# Patient Record
Sex: Female | Born: 1945 | Race: White | Hispanic: No | Marital: Married | Smoking: Never smoker
Health system: Southern US, Community
[De-identification: ages and names within clinical notes are randomized; demographics above are authoritative.]

## PROBLEM LIST (undated history)

## (undated) DIAGNOSIS — M8580 Other specified disorders of bone density and structure, unspecified site: Secondary | ICD-10-CM

## (undated) DIAGNOSIS — J349 Unspecified disorder of nose and nasal sinuses: Secondary | ICD-10-CM

## (undated) DIAGNOSIS — I7300 Raynaud's syndrome without gangrene: Secondary | ICD-10-CM

## (undated) DIAGNOSIS — M792 Neuralgia and neuritis, unspecified: Secondary | ICD-10-CM

## (undated) DIAGNOSIS — N6019 Diffuse cystic mastopathy of unspecified breast: Secondary | ICD-10-CM

## (undated) DIAGNOSIS — F419 Anxiety disorder, unspecified: Secondary | ICD-10-CM

## (undated) DIAGNOSIS — H8109 Meniere's disease, unspecified ear: Secondary | ICD-10-CM

## (undated) DIAGNOSIS — G479 Sleep disorder, unspecified: Secondary | ICD-10-CM

## (undated) HISTORY — PX: UPPER GASTROINTESTINAL ENDOSCOPY: SHX188

## (undated) HISTORY — PX: COLONOSCOPY: SHX174

## (undated) HISTORY — PX: TONSILLECTOMY AND ADENOIDECTOMY: SUR1326

## (undated) HISTORY — PX: EYE SURGERY: SHX253

## (undated) HISTORY — DX: Neuralgia and neuritis, unspecified: M79.2

## (undated) HISTORY — PX: KNEE SURGERY: SHX244

## (undated) HISTORY — DX: Raynaud's syndrome without gangrene: I73.00

## (undated) HISTORY — DX: Unspecified disorder of nose and nasal sinuses: J34.9

## (undated) HISTORY — DX: Other specified disorders of bone density and structure, unspecified site: M85.80

## (undated) HISTORY — DX: Meniere's disease, unspecified ear: H81.09

## (undated) HISTORY — DX: Sleep disorder, unspecified: G47.9

## (undated) HISTORY — DX: Diffuse cystic mastopathy of unspecified breast: N60.19

## (undated) HISTORY — DX: Anxiety disorder, unspecified: F41.9

---

## 1995-08-05 HISTORY — PX: PARTIAL HYSTERECTOMY: SHX80

## 1997-12-13 ENCOUNTER — Ambulatory Visit (HOSPITAL_COMMUNITY): Admission: RE | Admit: 1997-12-13 | Discharge: 1997-12-13 | Payer: Self-pay | Admitting: *Deleted

## 1998-05-17 ENCOUNTER — Emergency Department (HOSPITAL_COMMUNITY): Admission: EM | Admit: 1998-05-17 | Discharge: 1998-05-17 | Payer: Self-pay | Admitting: Emergency Medicine

## 1998-11-19 ENCOUNTER — Other Ambulatory Visit: Admission: RE | Admit: 1998-11-19 | Discharge: 1998-11-19 | Payer: Self-pay | Admitting: Gynecology

## 1999-03-01 ENCOUNTER — Ambulatory Visit (HOSPITAL_COMMUNITY): Admission: RE | Admit: 1999-03-01 | Discharge: 1999-03-01 | Payer: Self-pay | Admitting: Gynecology

## 1999-03-01 ENCOUNTER — Encounter: Payer: Self-pay | Admitting: Gynecology

## 2000-03-03 ENCOUNTER — Encounter: Payer: Self-pay | Admitting: *Deleted

## 2000-03-03 ENCOUNTER — Ambulatory Visit (HOSPITAL_COMMUNITY): Admission: RE | Admit: 2000-03-03 | Discharge: 2000-03-03 | Payer: Self-pay | Admitting: *Deleted

## 2000-03-12 ENCOUNTER — Encounter: Payer: Self-pay | Admitting: General Surgery

## 2000-03-12 ENCOUNTER — Inpatient Hospital Stay (HOSPITAL_COMMUNITY): Admission: EM | Admit: 2000-03-12 | Discharge: 2000-03-13 | Payer: Self-pay | Admitting: Emergency Medicine

## 2000-03-12 ENCOUNTER — Encounter: Payer: Self-pay | Admitting: Emergency Medicine

## 2000-03-18 ENCOUNTER — Other Ambulatory Visit: Admission: RE | Admit: 2000-03-18 | Discharge: 2000-03-18 | Payer: Self-pay | Admitting: *Deleted

## 2001-03-08 ENCOUNTER — Encounter: Payer: Self-pay | Admitting: *Deleted

## 2001-03-08 ENCOUNTER — Ambulatory Visit (HOSPITAL_COMMUNITY): Admission: RE | Admit: 2001-03-08 | Discharge: 2001-03-08 | Payer: Self-pay | Admitting: *Deleted

## 2002-01-20 ENCOUNTER — Encounter: Admission: RE | Admit: 2002-01-20 | Discharge: 2002-01-20 | Payer: Self-pay | Admitting: Family Medicine

## 2002-01-20 ENCOUNTER — Encounter: Payer: Self-pay | Admitting: Family Medicine

## 2002-10-25 ENCOUNTER — Encounter: Admission: RE | Admit: 2002-10-25 | Discharge: 2002-10-25 | Payer: Self-pay | Admitting: Family Medicine

## 2002-10-25 ENCOUNTER — Encounter: Payer: Self-pay | Admitting: Family Medicine

## 2004-02-02 ENCOUNTER — Ambulatory Visit (HOSPITAL_COMMUNITY): Admission: RE | Admit: 2004-02-02 | Discharge: 2004-02-02 | Payer: Self-pay | Admitting: *Deleted

## 2005-02-10 ENCOUNTER — Ambulatory Visit (HOSPITAL_COMMUNITY): Admission: RE | Admit: 2005-02-10 | Discharge: 2005-02-10 | Payer: Self-pay | Admitting: *Deleted

## 2006-02-12 ENCOUNTER — Ambulatory Visit (HOSPITAL_COMMUNITY): Admission: RE | Admit: 2006-02-12 | Discharge: 2006-02-12 | Payer: Self-pay | Admitting: Obstetrics and Gynecology

## 2006-05-14 ENCOUNTER — Encounter: Admission: RE | Admit: 2006-05-14 | Discharge: 2006-05-14 | Payer: Self-pay | Admitting: Family Medicine

## 2007-02-10 ENCOUNTER — Encounter: Admission: RE | Admit: 2007-02-10 | Discharge: 2007-02-10 | Payer: Self-pay | Admitting: Orthopedic Surgery

## 2007-02-15 ENCOUNTER — Ambulatory Visit (HOSPITAL_COMMUNITY): Admission: RE | Admit: 2007-02-15 | Discharge: 2007-02-15 | Payer: Self-pay | Admitting: Obstetrics & Gynecology

## 2007-02-24 ENCOUNTER — Encounter: Admission: RE | Admit: 2007-02-24 | Discharge: 2007-02-24 | Payer: Self-pay | Admitting: Orthopedic Surgery

## 2007-03-15 ENCOUNTER — Other Ambulatory Visit: Admission: RE | Admit: 2007-03-15 | Discharge: 2007-03-15 | Payer: Self-pay | Admitting: Obstetrics and Gynecology

## 2007-03-30 ENCOUNTER — Ambulatory Visit (HOSPITAL_COMMUNITY): Admission: RE | Admit: 2007-03-30 | Discharge: 2007-03-30 | Payer: Self-pay | Admitting: Obstetrics & Gynecology

## 2008-03-15 ENCOUNTER — Ambulatory Visit (HOSPITAL_COMMUNITY): Admission: RE | Admit: 2008-03-15 | Discharge: 2008-03-15 | Payer: Self-pay | Admitting: Obstetrics & Gynecology

## 2008-03-30 ENCOUNTER — Encounter: Admission: RE | Admit: 2008-03-30 | Discharge: 2008-03-30 | Payer: Self-pay | Admitting: Obstetrics & Gynecology

## 2008-08-04 HISTORY — PX: BREAST EXCISIONAL BIOPSY: SUR124

## 2008-08-22 ENCOUNTER — Encounter: Admission: RE | Admit: 2008-08-22 | Discharge: 2008-08-22 | Payer: Self-pay | Admitting: Internal Medicine

## 2009-01-18 ENCOUNTER — Encounter: Admission: RE | Admit: 2009-01-18 | Discharge: 2009-01-18 | Payer: Self-pay | Admitting: Neurology

## 2009-01-22 ENCOUNTER — Encounter: Admission: RE | Admit: 2009-01-22 | Discharge: 2009-01-22 | Payer: Self-pay | Admitting: Obstetrics and Gynecology

## 2009-03-14 ENCOUNTER — Ambulatory Visit (HOSPITAL_BASED_OUTPATIENT_CLINIC_OR_DEPARTMENT_OTHER): Admission: RE | Admit: 2009-03-14 | Discharge: 2009-03-14 | Payer: Self-pay | Admitting: Surgery

## 2009-03-14 ENCOUNTER — Encounter (INDEPENDENT_AMBULATORY_CARE_PROVIDER_SITE_OTHER): Payer: Self-pay | Admitting: Surgery

## 2010-04-15 ENCOUNTER — Ambulatory Visit (HOSPITAL_COMMUNITY): Admission: RE | Admit: 2010-04-15 | Discharge: 2010-04-15 | Payer: Self-pay | Admitting: Surgery

## 2010-08-14 ENCOUNTER — Encounter: Payer: Self-pay | Admitting: Gastroenterology

## 2010-08-26 ENCOUNTER — Encounter: Payer: Self-pay | Admitting: Orthopedic Surgery

## 2010-08-26 ENCOUNTER — Encounter: Payer: Self-pay | Admitting: Obstetrics & Gynecology

## 2010-09-02 ENCOUNTER — Encounter (INDEPENDENT_AMBULATORY_CARE_PROVIDER_SITE_OTHER): Payer: Self-pay | Admitting: *Deleted

## 2010-09-05 NOTE — Letter (Signed)
Summary: Colonoscopy Letter  [facility]  [address] [place], [place] [address]   Phone: [phone]  Fax: [phone]      August 14, 2010 MRN: [id number]   [facility] [address] [place], [place]  [address]   Dear Ms. Start,   According to your medical record, it is time for you to schedule a Colonoscopy. The American Cancer Society recommends this procedure as a method to detect early colon cancer. Patients with a family history of colon cancer, or a personal history of colon polyps or inflammatory bowel disease are at increased risk.  This letter has been generated based on the recommendations made at the time of your procedure. If you feel that in your particular situation this may no longer apply, please contact our office.  Please call our office at [phone] to schedule this appointment or to update your records at your earliest convenience.  Thank you for cooperating with [place] to provide you with the very best care possible.   Sincerely,  Judie Petit T. Russella Dar, M.D.  [facility] [phone]

## 2010-09-10 ENCOUNTER — Encounter (INDEPENDENT_AMBULATORY_CARE_PROVIDER_SITE_OTHER): Payer: Self-pay | Admitting: *Deleted

## 2010-09-11 ENCOUNTER — Encounter: Payer: Self-pay | Admitting: Gastroenterology

## 2010-09-11 NOTE — Letter (Signed)
Summary: Pre Visit Letter Revised  [facility]  [address] [place], [place] [address]   Phone: [phone]  Fax: [phone]        09/02/2010 MRN: [id number] Samantha Daugherty [address] [name], [place]  [address]  [place]                           Procedure Date: September 18, 2010                 recall col Dr Russella Dar  Welcome to the Gastroenterology Division at [facility].    You are scheduled to see a nurse for your pre-procedure visit on September 11, 2010 at 1:30pm on the 3rd floor at [facility], [address]  We ask that you try to arrive at our office 15 minutes prior to your appointment time to allow for check-in.  Please take a minute to review the attached form.  If you answer "Yes" to one or more of the questions on the first page, we ask that you call the person listed at your earliest opportunity.  If you answer "No" to all of the questions, please complete the rest of the form and bring it to your appointment.    Your nurse visit will consist of discussing your medical and surgical history, your immediate family medical history, and your medications.   If you are unable to list all of your medications on the form, please bring the medication bottles to your appointment and we will list them.  We will need to be aware of both prescribed and over the counter drugs.  We will need to know exact dosage information as well.    Please be prepared to read and sign documents such as consent forms, a financial agreement, and acknowledgement forms.  If necessary, and with your consent, a friend or relative is welcome to sit-in on the nurse visit with you.  Please bring your insurance card so that we may make a copy of it.  If your insurance requires a referral to see a specialist, please bring your referral form from your primary care physician.  No co-pay is required for this nurse visit.     If you cannot keep your appointment, please call  [phone] to cancel or reschedule prior to your appointment date.  This allows [place] the opportunity to schedule an appointment for another patient in need of care.    Thank you for choosing  Gastroenterology for your medical needs.  We appreciate the opportunity to care for you.  Please visit [place] at our website  to learn more about our practice.  Sincerely, The Gastroenterology Division

## 2010-09-18 ENCOUNTER — Encounter: Payer: Self-pay | Admitting: Gastroenterology

## 2010-09-18 ENCOUNTER — Other Ambulatory Visit (AMBULATORY_SURGERY_CENTER): Payer: BC Managed Care – PPO | Admitting: Gastroenterology

## 2010-09-18 DIAGNOSIS — Z1211 Encounter for screening for malignant neoplasm of colon: Secondary | ICD-10-CM

## 2010-09-18 DIAGNOSIS — K648 Other hemorrhoids: Secondary | ICD-10-CM

## 2010-09-19 NOTE — Letter (Signed)
Summary: [facility] Instructions  [facility]  [address] [place], [place] [address]   Phone: [phone]  Fax: [phone]       JUNIE AVILLA    [birth year]    MRN: [id number]        Procedure Day Dorna Bloom:  [facility] 09/18/10     Arrival Time:  10:00am     Procedure Time: 11:00am     Location of Procedure:                    Juliann Pares  [facility] (4th Floor)                       PREPARATION FOR COLONOSCOPY WITH MOVIPREP   Starting 5 days prior to your procedure  FRIDAY 02/10  do not eat nuts, seeds, popcorn, corn, beans, peas,  salads, or any raw vegetables.  Do not take any fiber supplements (e.g. Metamucil, Citrucel, and Benefiber).  THE DAY BEFORE YOUR PROCEDURE         DATE:  TUESDAY 02/14  1.  Drink clear liquids the entire day-NO SOLID FOOD  2.  Do not drink anything colored red or purple.  Avoid juices with pulp.  No orange juice.  3.  Drink at least 64 oz. (8 glasses) of fluid/clear liquids during the day to prevent dehydration and help the prep work efficiently.  CLEAR LIQUIDS INCLUDE: Water Jello Ice Popsicles Tea (sugar ok, no milk/cream) Powdered fruit flavored drinks Coffee (sugar ok, no milk/cream) Gatorade Juice: apple, white grape, white cranberry  Lemonade Clear bullion, consomm, broth Carbonated beverages (any kind) Strained chicken noodle soup Hard Candy                             4.  In the morning, mix first dose of MoviPrep solution:    Empty 1 Pouch A and 1 Pouch B into the disposable container    Add lukewarm drinking water to the top line of the container. Mix to dissolve    Refrigerate (mixed solution should be used within 24 hrs)  5.  Begin drinking the prep at 5:00 p.m. The MoviPrep container is divided by 4 marks.   Every 15 minutes drink the solution down to the next mark (approximately 8 oz) until the full liter is complete.   6.  Follow completed prep with 16 oz of clear liquid of your choice  (Nothing red or purple).  Continue to drink clear liquids until bedtime.  7.  Before going to bed, mix second dose of MoviPrep solution:    Empty 1 Pouch A and 1 Pouch B into the disposable container    Add lukewarm drinking water to the top line of the container. Mix to dissolve    Refrigerate  THE DAY OF YOUR PROCEDURE      DATE: [facility]  02/15  Beginning at  6:00 a.m. (5 hours before procedure):         1. Every 15 minutes, drink the solution down to the next mark (approx 8 oz) until the full liter is complete.  2. Follow completed prep with 16 oz. of clear liquid of your choice.    3. You may drink clear liquids until 9:00am  (2 HOURS BEFORE PROCEDURE).   MEDICATION INSTRUCTIONS  Unless otherwise instructed, you should take regular prescription medications with a small sip of water   as early as possible the  morning of your procedure.         OTHER INSTRUCTIONS  You will need a responsible adult at least 63 years of age to accompany you and drive you home.   This person must remain in the waiting room during your procedure.  Wear loose fitting clothing that is easily removed.  Leave jewelry and other valuables at home.  However, you may wish to bring a book to read or  an iPod/MP3 player to listen to music as you wait for your procedure to start.  Remove all body piercing jewelry and leave at home.  Total time from sign-in until discharge is approximately 2-3 hours.  You should go home directly after your procedure and rest.  You can resume normal activities the  day after your procedure.  The day of your procedure you should not:   Drive   Make legal decisions   Operate machinery   Drink alcohol   Return to work  You will receive specific instructions about eating, activities and medications before you leave.    The above instructions have been reviewed and explained to me by   Karl Bales RN  September 11, 2010 2:17 PM   I fully  understand and can verbalize these instructions _____________________________ Date _________

## 2010-09-19 NOTE — Miscellaneous (Signed)
Summary: [facility] previsit  Clinical Lists Changes  Medications: Added new medication of MOVIPREP 100 GM  SOLR (PEG-KCL-NACL-NASULF-NA ASC-C) As per prep instructions. - Signed Rx of MOVIPREP 100 GM  SOLR (PEG-KCL-NACL-NASULF-NA ASC-C) As per prep instructions.;  #1 x 0;  Signed;  Entered by: Karl Bales RN;  Authorized by: Meryl Dare MD [facility];  Method used: [facility] to [facility]  [phone]*, [address] [place], [place]  [address], Ph: [phone] or [phone], Fax: [phone] Observations: Added new observation of NKA: T (09/11/2010 13:52)    Prescriptions: MOVIPREP 100 GM  SOLR (PEG-KCL-NACL-NASULF-NA ASC-C) As per prep instructions.  #1 x 0   Entered by:   Karl Bales RN   Authorized by:   Meryl Dare MD [facility]   Signed by:   Karl Bales RN on 09/11/2010   Method used:   [facility] to        [facility]  [phone]* (retail)       [address] [place], [place]  [address]       Ph: [phone] or [phone]       Fax: [phone]   RxID:   [id number]

## 2010-09-25 NOTE — Procedures (Signed)
Summary: Colonoscopy  Patient: Samantha Daugherty Note: All result statuses are Final unless otherwise noted.  Tests: (1) Colonoscopy (COL)   COL Colonoscopy           DONE     [facility]     [address]     [place], [place]  [address]           COLONOSCOPY PROCEDURE REPORT           PATIENT:  Samantha Daugherty, Samantha Daugherty  MR#:  [id number]     BIRTHDATE:  Nov 03, 1945, 64 yrs. old  GENDER:  female     ENDOSCOPIST:  Judie Petit T. Russella Dar, MD, [facility]           PROCEDURE DATE:  09/18/2010     PROCEDURE:  Colonoscopy [address]     ASA CLASS:  Class II     INDICATIONS:  1) Routine Risk Screening     MEDICATIONS:   Fentanyl 75 mcg IV, Versed 9 mg IV     DESCRIPTION OF PROCEDURE:   After the risks benefits and     alternatives of the procedure were thoroughly explained, informed     consent was obtained.  Digital rectal exam was performed and     revealed no abnormalities.   The LB [id number]-[id number] endoscope     was introduced through the anus and advanced to the cecum, which     was identified by both the appendix and ileocecal valve, limited     by a tortuous and redundant colon. The quality of the prep was     good, using MoviPrep.  The instrument was then slowly withdrawn as     the colon was fully examined.     <<PROCEDUREIMAGES>>     FINDINGS:  A normal appearing cecum, ileocecal valve, and     appendiceal orifice were identified. The ascending, hepatic     flexure, transverse, splenic flexure, descending, sigmoid colon,     and rectum appeared unremarkable. Retroflexed views in the rectum     revealed internal hemorrhoids, small. The time to cecum =  6.25     minutes. The scope was then withdrawn (time =  11  min) from the     patient and the procedure completed.           COMPLICATIONS:  None           ENDOSCOPIC IMPRESSION:     1) Internal hemorrhoids     2) Otherwise normal colonoscopy           RECOMMENDATIONS:     1) Continue current colorectal screening for "routine risk"  patients with a repeat colonoscopy in 10 years.           Venita Lick. Russella Dar, MD, [facility]           CC:  Theressa Millard, MD           n.     [name] at 09/18/2010 11:16 AM           [name], [phone]  Note: An exclamation mark (!) indicates a result that was not dispersed into the flowsheet. Document Creation Date: 09/18/2010 11:17 AM _______________________________________________________________________  (1) Order result status: Final Collection or observation date-time: 09/18/2010 11:08 Requested date-time:  Receipt date-time:  Reported date-time:  Referring Physician:   Ordering Physician: Claudette Head [phone]) Specimen Source:  Source: Launa Grill Order Number: [id number] Lab site:   Appended Document: Colonoscopy  Clinical Lists Changes  Observations: Added new observation of COLONNXTDUE: 09/2020 (09/18/2010 16:55)

## 2010-11-09 LAB — BASIC METABOLIC PANEL
BUN: 9 mg/dL (ref 6–23)
Calcium: 9.1 mg/dL (ref 8.4–10.5)
Creatinine, Ser: 0.95 mg/dL (ref 0.4–1.2)
GFR calc Af Amer: >60 mL/min (ref >60)
GFR calc non Af Amer: 60 mL/min — ABNORMAL LOW (ref >60)
Glucose, Bld: 102 mg/dL — ABNORMAL HIGH (ref 70–99)

## 2010-11-09 LAB — POCT HEMOGLOBIN-HEMACUE: Hemoglobin: 13.8 g/dL (ref 12.0–15.0)

## 2010-12-17 NOTE — Op Note (Signed)
Samantha Daugherty, Samantha Daugherty               ACCOUNT NO.:  [id number]   MEDICAL RECORD NO.:  [id number]          PATIENT TYPE:  AMB   LOCATION:  DSC                          FACILITY:  [facility]   PHYSICIAN:  Currie Paris, M.D.DATE OF BIRTH:  1945/09/12   DATE OF PROCEDURE:  DATE OF DISCHARGE:                               OPERATIVE REPORT   PREOPERATIVE DIAGNOSIS:  Nipple discharge, right breast.   POSTOPERATIVE DIAGNOSIS:  Nipple discharge, right breast.   OPERATION:  Ductal excision, right breast.   SURGEON:  Currie Paris, MD   ANESTHESIA:  General.   CLINICAL HISTORY:  This is a 65 year old lady with intermittent nipple  discharge from the right breast that was spontaneous, occasionally  bloody.  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   DESCRIPTION OF PROCEDURE:  The patient was seen in the holding area, and  she had no further questions.  We both marked the right breast as the  operative side.   The patient was taken to the operating room.  After satisfactory general  anesthesia had been obtained, the breast was prepped and draped.  The  time-out was done.   I used a 4.0 tear duct probe and was able to cannulate the appropriate  duct as it was readily producing clear yellow discharge.  I was able to  dilate this up to a 3.0 and then place a 24-gauge Angiocath and I put a  little bit of blue dye in.  The tear duct probe was then replaced.   I then made a curvilinear incision at the areolar margin and elevated  the skin up and created a skin flap over towards the duct and was able  to identify the probe and the duct and divided the duct just underneath  the nipple and grasped the tract of it with an Allis clamp.  There was  also blue dye that had extruded and I took area of the central ductal  excision to include the area that I thought was involved with the duct  as well  as that the area where blue dye had spilled out.  I did see what  looked like some dilated ducts filled with a thick brownish liquidy  fluid consistent with some fibrocystic changes.   Once I had the area well sampled, I put in some Marcaine 0.25% plain for  postop pain relief and then closed in layers with some 3-0 Vicryl, 4-0  Monocryl subcuticular plus Dermabond.   The patient tolerated the procedure well.  There were no complications.  All counts were correct.      Currie Paris, M.D.  Electronically Signed     CJS/MEDQ  D:  03/14/2009  T:  03/15/2009  Job:  [id number]   cc:   Theressa Millard, M.D.  Lum Keas, MD

## 2010-12-20 NOTE — H&P (Signed)
[facility]  Patient:    Samantha Daugherty, Samantha Daugherty                      MRN: [id number] Adm. Date:  [id number] Attending:  Roque Lias                         History and Physical  CHIEF COMPLAINT:  "Abdominal pain."  HISTORY OF PRESENT ILLNESS:  The patient is a 65 year old female who has had crampy lower abdominal midline pain since 3 p.m. yesterday.  The pain lasted several hours, then got better, but then began again this evening and was fairly severe this evening.  Patient states it is about the equivalent of labor pains.  It has been associated with nausea and large-volume emesis, without blood or coffee-grounds material.  Patient has not had a bowel movement in two days.  She denies fever, chills, urinary symptoms or GYN complaints.  SURGERIES:  Patient had a hysterectomy in 1997 due to fibroid tumors.  OTHER HOSPITALIZATIONS:  None.  OTHER ILLNESSES:  Patient has been treated for hypertension.  MEDICATIONS 1. Zestril 10 mg once a day. 2. Premarin 0.625 mg once a day. 3. Alprazolam p.r.n.  ALLERGIES:  None.  FAMILY HISTORY:  No family members have had any recent diarrheal or gastroenteritis symptoms; otherwise, unremarkable.  SOCIAL HISTORY:  The patient is married.  She works as a Runner, broadcasting/film/video.  She drinks socially and has never used tobacco.  REVIEW OF SYSTEMS:  Denies other systemic, skin, eye, ENT, respiratory, cardiovascular, GI, GU, musculoskeletal or neurologic complaints.  PHYSICAL EXAMINATION  VITAL SIGNS:  Blood pressure 176/103, pulse 88 and regular, respirations 16, temperature 97.5.  GENERAL:  Alert.  Patient vomited a large amount of digested food while being examined.  SKIN:  Clear, warm and dry.  No rash.  HEENT:  Eyes:  Pupils are equal, round and reactive to light.  Full EOMs. Fundi were benign.  Disks were flat and sclerae were nonicteric.  ENT:  TMs normal.  No intraoral lesions.  Pharynx clear.  Mucous membranes  moist.  NECK:  Supple.  No adenopathy, JVD or bruit.  Thyroid normal.  LUNGS:  Clear to auscultation and percussion.  HEART:  Regular rate and rhythm.  No gallop or murmur.  ABDOMEN:  Soft.  There was moderate left lower quadrant and lower midline abdominal pain to palpation, without guarding or rebound.  There was no right lower quadrant or upper abdominal pain to palpation.  There was no hepatosplenomegaly or mass.  Bowel sounds were hyperactive.  RECTAL:  No masses.  Stool was heme-negative.  EXTREMITIES:  No edema.  Pulses full.  NEUROLOGIC:  Alert, oriented x 3.  Speech was clear and appropriate.  No extremity weakness or tremor.  DTRs 2+ and symmetrical.  Babinskis downgoing. Cranial nerves intact.  LABORATORY AND X-RAY FINDINGS:  CBC shows hemoglobin 15.4, WBC 13,400.  CMET reveals a glucose of 122; otherwise, normal.  Amylase was 96.  KUB shows multiple dilated loops of bowel with air-fluid levels.  IMPRESSION 1. Partial small-bowel obstruction. 2. Hypertension.  PLAN 1. IV fluids. 2. NG suction. 3. Surgical consult. DD:  03/12/00 TD:  03/12/00 Job: [id number]

## 2011-04-29 ENCOUNTER — Other Ambulatory Visit (HOSPITAL_COMMUNITY): Payer: Self-pay | Admitting: Obstetrics and Gynecology

## 2011-04-29 DIAGNOSIS — Z1231 Encounter for screening mammogram for malignant neoplasm of breast: Secondary | ICD-10-CM

## 2011-05-21 ENCOUNTER — Ambulatory Visit (HOSPITAL_COMMUNITY)
Admission: RE | Admit: 2011-05-21 | Discharge: 2011-05-21 | Disposition: A | Discharge status: Home / Self Care | Payer: BC Managed Care – PPO | Source: Ambulatory Visit | Attending: Obstetrics and Gynecology | Admitting: Obstetrics and Gynecology

## 2011-05-21 DIAGNOSIS — Z1231 Encounter for screening mammogram for malignant neoplasm of breast: Secondary | ICD-10-CM | POA: Insufficient documentation

## 2012-04-26 ENCOUNTER — Other Ambulatory Visit (HOSPITAL_COMMUNITY): Payer: Self-pay | Admitting: Obstetrics and Gynecology

## 2012-04-26 DIAGNOSIS — Z1231 Encounter for screening mammogram for malignant neoplasm of breast: Secondary | ICD-10-CM

## 2012-05-24 ENCOUNTER — Ambulatory Visit (HOSPITAL_COMMUNITY)
Admission: RE | Admit: 2012-05-24 | Discharge: 2012-05-24 | Disposition: A | Discharge status: Home / Self Care | Payer: BC Managed Care – PPO | Source: Ambulatory Visit | Attending: Obstetrics and Gynecology | Admitting: Obstetrics and Gynecology

## 2012-05-24 DIAGNOSIS — Z1231 Encounter for screening mammogram for malignant neoplasm of breast: Secondary | ICD-10-CM

## 2013-04-21 ENCOUNTER — Other Ambulatory Visit (HOSPITAL_COMMUNITY): Payer: Self-pay | Admitting: Obstetrics and Gynecology

## 2013-04-21 DIAGNOSIS — Z1231 Encounter for screening mammogram for malignant neoplasm of breast: Secondary | ICD-10-CM

## 2013-05-27 ENCOUNTER — Ambulatory Visit (HOSPITAL_COMMUNITY)
Admission: RE | Admit: 2013-05-27 | Discharge: 2013-05-27 | Disposition: A | Discharge status: Home / Self Care | Payer: Medicare Other | Source: Ambulatory Visit | Attending: Obstetrics and Gynecology | Admitting: Obstetrics and Gynecology

## 2013-05-27 DIAGNOSIS — Z1231 Encounter for screening mammogram for malignant neoplasm of breast: Secondary | ICD-10-CM | POA: Insufficient documentation

## 2013-07-04 ENCOUNTER — Encounter: Payer: Self-pay | Admitting: Podiatry

## 2013-07-04 ENCOUNTER — Ambulatory Visit (INDEPENDENT_AMBULATORY_CARE_PROVIDER_SITE_OTHER): Payer: Medicare Other

## 2013-07-04 ENCOUNTER — Ambulatory Visit (INDEPENDENT_AMBULATORY_CARE_PROVIDER_SITE_OTHER): Payer: Medicare Other | Admitting: Podiatry

## 2013-07-04 VITALS — BP 139/79 | HR 89 | Resp 16 | Ht 63.0 in | Wt 122.0 lb

## 2013-07-04 DIAGNOSIS — M898X9 Other specified disorders of bone, unspecified site: Secondary | ICD-10-CM

## 2013-07-04 DIAGNOSIS — M2040 Other hammer toe(s) (acquired), unspecified foot: Secondary | ICD-10-CM

## 2013-07-04 DIAGNOSIS — L84 Corns and callosities: Secondary | ICD-10-CM

## 2013-07-04 NOTE — Progress Notes (Signed)
   Subjective:    Patient ID: Samantha Daugherty, female    DOB: 01-Jan-1946, 67 y.o.   MRN: [id number]  HPI Comments: "I've got these little toenails that are splitting"  N - aches L - 5th toes bilateral D - several mos O - gradual C - thickened nail/skin lateral corner A - shoes T - trimming, lotion  -Pt also has a callus medial left great toe-  Toe Pain       Review of Systems  Musculoskeletal: Positive for back pain.  All other systems reviewed and are negative.       Objective:   Physical Exam        Assessment & Plan:

## 2013-07-04 NOTE — Progress Notes (Signed)
Subjective:     Patient ID: Samantha Daugherty, female   DOB: Nov 08, 1945, 67 y.o.   MRN: [id number]  Toe Pain    patient states my left fifth toe can bother me my right little toe can hurt at times and this left big toe can hurt me if I wear tight shoes. States the left fifth toe has been hurting for several year   Review of Systems  All other systems reviewed and are negative.       Objective:   Physical Exam  Nursing note and vitals reviewed. Constitutional: She is oriented to person, place, and time. She appears well-developed and well-nourished.  Cardiovascular: Intact distal pulses.   Musculoskeletal: Normal range of motion.  Neurological: She is oriented to person, place, and time.  Skin: Skin is warm.   patient has rotated fifth toe left over right with distal lateral keratotic lesion that is painful due to the structure of the digit. 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 Debridement of lesions accomplished with consideration for derotational arthroplasty or possible distal lateral exostectomy which was explained to patient

## 2013-07-08 ENCOUNTER — Other Ambulatory Visit: Payer: Self-pay | Admitting: Orthopedic Surgery

## 2013-07-08 ENCOUNTER — Ambulatory Visit
Admission: RE | Admit: 2013-07-08 | Discharge: 2013-07-08 | Disposition: A | Discharge status: Home / Self Care | Payer: 59 | Source: Ambulatory Visit | Attending: Orthopedic Surgery | Admitting: Orthopedic Surgery

## 2013-07-08 DIAGNOSIS — S32000K Wedge compression fracture of unspecified lumbar vertebra, subsequent encounter for fracture with nonunion: Secondary | ICD-10-CM

## 2013-07-20 ENCOUNTER — Other Ambulatory Visit: Payer: BC Managed Care – PPO

## 2014-03-22 ENCOUNTER — Other Ambulatory Visit: Payer: Self-pay | Admitting: Dermatology

## 2014-04-24 ENCOUNTER — Other Ambulatory Visit (HOSPITAL_COMMUNITY): Payer: Self-pay | Admitting: Obstetrics and Gynecology

## 2014-04-24 DIAGNOSIS — Z1231 Encounter for screening mammogram for malignant neoplasm of breast: Secondary | ICD-10-CM

## 2014-06-09 ENCOUNTER — Ambulatory Visit (HOSPITAL_COMMUNITY)
Admission: RE | Admit: 2014-06-09 | Discharge: 2014-06-09 | Disposition: A | Discharge status: Home / Self Care | Payer: Medicare Other | Source: Ambulatory Visit | Attending: Obstetrics and Gynecology | Admitting: Obstetrics and Gynecology

## 2014-06-09 DIAGNOSIS — Z1231 Encounter for screening mammogram for malignant neoplasm of breast: Secondary | ICD-10-CM

## 2015-03-23 ENCOUNTER — Encounter: Payer: Medicare Other | Admitting: Podiatry

## 2015-04-04 NOTE — Progress Notes (Signed)
This encounter was created in error - please disregard.

## 2015-04-25 ENCOUNTER — Ambulatory Visit: Payer: Medicare Other | Admitting: Podiatry

## 2015-05-09 ENCOUNTER — Other Ambulatory Visit: Payer: Self-pay

## 2015-05-09 DIAGNOSIS — Z1231 Encounter for screening mammogram for malignant neoplasm of breast: Secondary | ICD-10-CM

## 2015-05-11 ENCOUNTER — Encounter: Payer: Self-pay | Admitting: Gastroenterology

## 2015-05-15 ENCOUNTER — Ambulatory Visit (INDEPENDENT_AMBULATORY_CARE_PROVIDER_SITE_OTHER): Payer: Medicare Other

## 2015-05-15 ENCOUNTER — Encounter: Payer: Self-pay | Admitting: Podiatry

## 2015-05-15 ENCOUNTER — Ambulatory Visit (INDEPENDENT_AMBULATORY_CARE_PROVIDER_SITE_OTHER): Payer: Medicare Other | Admitting: Podiatry

## 2015-05-15 VITALS — BP 141/83 | HR 82 | Resp 12

## 2015-05-15 DIAGNOSIS — R52 Pain, unspecified: Secondary | ICD-10-CM

## 2015-05-15 DIAGNOSIS — M2021 Hallux rigidus, right foot: Secondary | ICD-10-CM

## 2015-05-15 NOTE — Patient Instructions (Signed)
Hallux Rigidus Hallux rigidus is a condition involving pain and a loss of motion of the first (big) toe. The pain gets worse with lifting up (extension) of the toe. This is usually due to arthritic bony bumps (spurring) of the joint at the base of the big toe.  SYMPTOMS   Pain, with lifting up of the toe.  Tenderness over the joint where the big toe meets the foot.  Redness, swelling, and warmth over the top of the base of the big toe (sometimes).  Foot pain, stiffness, and limping. 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 The arthritis creates a bone spur that pinches the soft tissues when the toe is extended. RISK INCREASES WITH:  Tight shoes with a narrow toe box.  Family history of foot problems.  Gout and rheumatoid and psoriatic arthritis.  History of previous toe injury, including "turf toe."  Long first toe, flat feet, and other big toe bony bumps.  Arthritis of the big toe. PREVENTION   Wear wide-toed shoes that fit well.  Tape the big toe to reduce motion and to prevent pinching of the tissues between the bone.  Maintain physical fitness:  Foot and ankle flexibility.  Muscle strength and endurance. PROGNOSIS  This condition can usually be managed with proper treatment. However, surgery is typically required to prevent the problem from recurring.  RELATED COMPLICATIONS  Injury to other areas of the foot or ankle, caused by abnormal walking in an attempt to avoid the pain felt when walking normally. TREATMENT Treatment first involves stopping the activities that aggravate your symptoms. Ice and medicine can be used to reduce the pain and inflammation. Modifications to shoes may help reduce pain, including wearing stiff-soled shoes, shoes with a wide toe box, inserting a padded donut to relieve pressure on top of the joint, or wearing an arch support. Corticosteroid injections may be given to reduce inflammation. If  nonsurgical treatment is unsuccessful, surgery may be needed. Surgical options include removing the arthritic bony spur, cutting a bone in the foot to change the arc of motion (allowing the toe to extend more), or fusion of the joint (eliminating all motion in the joint at the base of the big toe).  MEDICATION   If pain medicine is needed, nonsteroidal anti-inflammatory medicines (aspirin and ibuprofen), or other minor pain relievers (acetaminophen), are often advised.  Do not take pain medicine for 7 days before surgery.  Prescription pain relievers are usually prescribed only after surgery. Use only as directed and only as much as you need.  Ointments for arthritis, applied to the skin, may give some relief.  Injections of corticosteroids may be given to reduce inflammation. HEAT AND COLD  Cold treatment (icing) relieves pain and reduces inflammation. Cold treatment should be applied for 10 to 15 minutes every 2 to 3 hours, and immediately after activity that aggravates your symptoms. Use ice packs or an ice massage.  Heat treatment may be used before performing the stretching and strengthening activities prescribed by your caregiver, physical therapist, or athletic trainer. Use a heat pack or a warm water soak. SEEK MEDICAL CARE IF:   Symptoms get worse or do not improve in 2 weeks, despite treatment.  After surgery you develop fever, increasing pain, redness, swelling, drainage of fluids, bleeding, or increasing warmth.  New, unexplained symptoms develop. (Drugs used in treatment may produce side effects.)   This information is not intended to replace advice given to   you by your health care provider. Make sure you discuss any questions you have with your health care provider.   Document Released: 07/21/2005 Document Revised: 08/11/2014 Document Reviewed: 11/02/2008 Elsevier Interactive Patient Education 2016 [employer]  

## 2015-05-15 NOTE — Progress Notes (Signed)
   Subjective:    Patient ID: Samantha Daugherty, female    DOB: 1945-10-13, 69 y.o.   MRN: [id number]  HPI   This patient presents with approximately year history of intermittent pain localized to the right great toe joint area aggravated with standing walking relieved with rest. The  Right first MPJ is more umcomfortable in the woman stress shoe and is less painful in a athletic style shoe. She says is  3/10. The pain in the right first MPJ is gradually worsening over time . She says that she's able to do all her activities and denies pain during the activity or inability to the activity because of right great toe joint pain  Review of Systems  All other systems reviewed and are negative.  Patient has a history of back pain     Objective:   Physical Exam  Dictated 3  Vascular: No peripheral edema noted bilaterally DP and PT pulses 2/4 bilaterally  Neurological: Sensation to 10 g monofilament wire intact 5/5 bilaterally Vibratory sensation intact bilaterally Ankle reflex equal and reactive bilaterally  Dermatological: No skin lesions noted bilaterally Texture and turgor within normal limits bilaterally  Musculoskeletal: Dorsal prominence the right first MPJ The first MPJ right is tender to direct dorsal palpation There is no pain or crepitus on range of motion first MPJ bilaterally  X-ray weightbearing right foot dated 05/15/2015  Intact bony structures without fracture and/or dislocation First MPJ is narrowed Bony dorsal proliferation head of first metatarsal Relatively long first metatarsal  Radiographic impression: No acute bony abnormality noted in the right foot Osteoarthritic changes first MPJ right     Assessment & Plan:   Assessment: Satisfactory neurovascular status Osteoarthritis right first MPJ/hallux limitus, right  Plan: Today I reviewed the results of examination and x-ray with patient in detail. I made aware that her primary problem was localized  osteoarthritis. We discussed treatment options including athletic style shoes, accommodative shoe inserts and surgery. At this time patient says that she can control her symptoms satisfactory when she wears athletic style shoes and that's which is choosing to do  Reappoint at patient's

## 2015-06-13 ENCOUNTER — Ambulatory Visit
Admission: RE | Admit: 2015-06-13 | Discharge: 2015-06-13 | Disposition: A | Discharge status: Home / Self Care | Payer: Medicare Other | Source: Ambulatory Visit

## 2015-06-13 DIAGNOSIS — Z1231 Encounter for screening mammogram for malignant neoplasm of breast: Secondary | ICD-10-CM

## 2016-07-25 ENCOUNTER — Other Ambulatory Visit: Payer: Self-pay | Admitting: Obstetrics and Gynecology

## 2016-07-25 DIAGNOSIS — Z1231 Encounter for screening mammogram for malignant neoplasm of breast: Secondary | ICD-10-CM

## 2016-08-22 ENCOUNTER — Ambulatory Visit
Admission: RE | Admit: 2016-08-22 | Discharge: 2016-08-22 | Disposition: A | Discharge status: Home / Self Care | Payer: Medicare Other | Source: Ambulatory Visit | Attending: Obstetrics and Gynecology | Admitting: Obstetrics and Gynecology

## 2016-08-22 DIAGNOSIS — Z1231 Encounter for screening mammogram for malignant neoplasm of breast: Secondary | ICD-10-CM

## 2016-08-25 ENCOUNTER — Encounter: Payer: Self-pay | Admitting: Neurology

## 2016-08-25 ENCOUNTER — Ambulatory Visit (INDEPENDENT_AMBULATORY_CARE_PROVIDER_SITE_OTHER): Payer: Medicare Other | Admitting: Neurology

## 2016-08-25 VITALS — BP 118/78 | HR 83 | Ht 63.0 in | Wt 126.0 lb

## 2016-08-25 DIAGNOSIS — M5414 Radiculopathy, thoracic region: Secondary | ICD-10-CM | POA: Diagnosis not present

## 2016-08-25 MED ORDER — LIDOCAINE 5 % EX OINT: 1.0000 "application " | TOPICAL_OINTMENT | Freq: Two times a day (BID) | CUTANEOUS | 5 refills | Status: DC | PRN

## 2016-08-25 MED ORDER — GABAPENTIN 400 MG PO CAPS: ORAL_CAPSULE | ORAL | 0 refills | Status: AC

## 2016-08-25 NOTE — Progress Notes (Signed)
[facility] Clinic Note - Initial Visit   Date: 08/25/16  KYMARA RUEN MRN: [id number] DOB: [birth year]   Dear Dr. Delfina Redwood:  Thank you for your kind referral of VALINCIA SCHALL for consultation of painful paresthesias. Although her history is well known to you, please allow [place] to reiterate it for the purpose of our medical record. The patient was accompanied to the clinic by self.    History of Present Illness: MERSAYDES OBST is a 71 y.o. Caucasian female with history of hypercholesterolemia and mild osteopenia presenting for evaluation of thoracic radiculopathy.    Starting about 10 years ago, she developed prickles sensation over the top of her back.  She used topical sprays which controlled symptoms for a while.  She went to a chiropractor and felt that symptoms became worse.  She has sharp pain over the right shoulder and sometimes into her right posterior arm.  Pain is worse when she is wearing a bra and with prolonged sitting.  If she sits with perfect posture, pain is alleviated.  She has tried accupuncture which made it worse.  She had no benefit with nerve block or physical therapy.  She has previously seen Dr. Erling Cruz and orthopaedics who mentioned symptom management.  She had a lipoma over the back which was resected and felt that symptoms were improved for about 6 months, but unfortunately returned.  She was started on gabapentin 1200-800-800mg  and Cymbalta 60mg  about 6 years ago which has provided relief.  Symptoms are no longer constant and keep symptoms controlled until later in the afternoon.  She is here to see if there are any new management options.    She has no history of diabetes.   Out-side paper records, electronic medical record, and images have been reviewed where available and summarized as:  MRI lumbar spine wo contrast 07/08/2013: 1. Acute or subacute superior endplate compression fracture at L3 with 20% loss of height. No bony  retropulsion. 2. Lumbar spondylosis and degenerative disc disease causing mild impingement at L4-5 and L5-S1.  CT thoracic spine wo contrast 01/18/2009: 1.  No acute abnormality of the thoracic spine. 2.  Remote L1 superior endplate compression fracture.  Past Medical History:  Diagnosis Date  . Fibrocystic breast   . Meniere disease   . Osteopenia   . Raynaud's syndrome   . Sinus problem   . Sleep disorder   . Thoracic neuralgia     Past Surgical History:  Procedure Laterality Date  . BREAST BIOPSY    . EYE SURGERY    . KNEE SURGERY    . TONSILLECTOMY AND ADENOIDECTOMY       Medications:  Outpatient Encounter Prescriptions as of 08/25/2016  Medication Sig Note  . ALPRAZolam (XANAX) 0.25 MG tablet Take 0.25 mg by mouth at bedtime as needed for anxiety.   . Ascorbic Acid (VITAMIN C) 1000 MG tablet Take 1,000 mg by mouth.   . B Complex Vitamins (VITAMIN-B COMPLEX) TABS Take by mouth.   . Calcium Carbonate-Vit D-Min (CALCIUM 600+D PLUS MINERALS) 600-400 MG-UNIT TABS Take by mouth.   . carisoprodol (SOMA) 350 MG tablet Take 350 mg by mouth 4 (four) times daily as needed for muscle spasms.   . DOCOSAHEXAENOIC ACID PO Take 1 g by mouth.   . DULoxetine (CYMBALTA) 60 MG capsule Take 60 mg by mouth daily.   Marland Kitchen gabapentin (NEURONTIN) 400 MG capsule Take 3 tablets at noon and 3 tablets at 6pm.   . Multiple Vitamin (MULTIVITAMIN)  capsule Take 1 capsule by mouth daily.   . vitamin E 400 UNIT capsule Take by mouth.   . zolpidem (AMBIEN) 10 MG tablet Take 5 mg by mouth.   . [DISCONTINUED] gabapentin (NEURONTIN) 400 MG capsule take 3 capsules in the morning, 2 at noon, 2 in the afternoon and 2 in the evening. 05/15/2015: Received from: External Pharmacy  . lidocaine (XYLOCAINE) 5 % ointment Apply 1 application topically 2 (two) times daily as needed. Use gloves to apply   . [DISCONTINUED] zolpidem (AMBIEN) 5 MG tablet Take 5 mg by mouth at bedtime as needed for sleep.    No  facility-administered encounter medications on file as of 08/25/2016.      Allergies: No Known Allergies  Family History:  No pertinent neurological family history of neuropathy or stroke.   Social History: Social History  Substance Use Topics  . Smoking status: Never Smoker  . Smokeless tobacco: Never Used  . Alcohol use Yes   Social History   Social History Narrative   Lives with husband.  Has 3 children.  Retired Pharmacist, hospital.  Education: college.     Review of Systems:  CONSTITUTIONAL: No fevers, chills, night sweats, or weight loss.   EYES: No visual changes or eye pain ENT: No hearing changes.  No history of nose bleeds.   RESPIRATORY: No cough, wheezing and shortness of breath.   CARDIOVASCULAR: Negative for chest pain, and palpitations.   GI: Negative for abdominal discomfort, blood in stools or black stools.  No recent change in bowel habits.   GU:  No history of incontinence.   MUSCLOSKELETAL: No history of joint pain or swelling.  No myalgias.   SKIN: Negative for lesions, rash, and itching.   HEMATOLOGY/ONCOLOGY: Negative for prolonged bleeding, bruising easily, and swollen nodes.  No history of cancer.   ENDOCRINE: Negative for cold or heat intolerance, polydipsia or goiter.   PSYCH:  No depression or anxiety symptoms.   NEURO: As Above.   Vital Signs:  BP 118/78   Pulse 83   Ht 5\' 3"  (1.6 m)   Wt 126 lb (57.2 kg)   SpO2 98%   BMI 22.32 kg/m    General Medical Exam:   General:  Well appearing, comfortable.   Eyes/ENT: see cranial nerve examination.   Neck: No masses appreciated.  Full range of motion without tenderness.  No carotid bruits. Respiratory:  Clear to auscultation, good air entry bilaterally.   Cardiac:  Regular rate and rhythm, no murmur.   Extremities:  No deformities, edema, or skin discoloration.  Skin:  No rashes or lesions.  Neurological Exam: MENTAL STATUS including orientation to time, place, person, recent and remote memory, attention  span and concentration, language, and fund of knowledge is normal.  Speech is not dysarthric.  CRANIAL NERVES: II:  No visual field defects.  Unremarkable fundi.   III-IV-VI: Pupils equal round and reactive to light.  Normal conjugate, extra-ocular eye movements in all directions of gaze.  No nystagmus.  No ptosis.   V:  Normal facial sensation.   VII:  Normal facial symmetry and movements.  No pathologic facial reflexes.  VIII:  Normal hearing and vestibular function.   IX-X:  Normal palatal movement.   XI:  Normal shoulder shrug and head rotation.   XII:  Normal tongue strength and range of motion, no deviation or fasciculation.  MOTOR:  No atrophy, fasciculations or abnormal movements.  No pronator drift.  Tone is normal.    Right Upper Extremity:  Left Upper Extremity:    Deltoid  5/5   Deltoid  5/5   Biceps  5/5   Biceps  5/5   Triceps  5/5   Triceps  5/5   Wrist extensors  5/5   Wrist extensors  5/5   Wrist flexors  5/5   Wrist flexors  5/5   Finger extensors  5/5   Finger extensors  5/5   Finger flexors  5/5   Finger flexors  5/5   Dorsal interossei  5/5   Dorsal interossei  5/5   Abductor pollicis  5/5   Abductor pollicis  5/5   Tone (Ashworth scale)  0  Tone (Ashworth scale)  0   Right Lower Extremity:    Left Lower Extremity:    Hip flexors  5/5   Hip flexors  5/5   Hip extensors  5/5   Hip extensors  5/5   Knee flexors  5/5   Knee flexors  5/5   Knee extensors  5/5   Knee extensors  5/5   Dorsiflexors  5/5   Dorsiflexors  5/5   Plantarflexors  5/5   Plantarflexors  5/5   Toe extensors  5/5   Toe extensors  5/5   Toe flexors  5/5   Toe flexors  5/5   Tone (Ashworth scale)  0  Tone (Ashworth scale)  0   MSRs:  Right                                                                 Left brachioradialis 2+  brachioradialis 2+  biceps 2+  biceps 2+  triceps 2+  triceps 2+  patellar 2+  patellar 2+  ankle jerk 2+  ankle jerk 2+  Hoffman no  Hoffman no  plantar  response down  plantar response down   SENSORY:  Normal and symmetric perception of light touch, pinprick, vibration, and proprioception in all the limbs.  Sensation of the back is normal and there is no sensory level.  Romberg's sign absent.   COORDINATION/GAIT: Normal finger-to- nose-finger and heel-to-shin.  Intact rapid alternating movements bilaterally.  Able to rise from a chair without using arms.  Gait narrow based and stable. Tandem and stressed gait intact.    IMPRESSION: Chronic thoracic radiculopathy involving the right T8-9 level.  Less likely notalgia paresthetica since she denies itching sensation and area of involvement extended beyond the inferior border of the scapula  - Her previous MRI thoracic spine from 2010 was reviewed and does not show nerve impingement or spinal cord changes  - She has no history of diabetes and previously has been checked for vitamin B12 deficiency.  She is taking a daily supplement  - Long discussion regarding management options, nearly all which she has already tried in the past.  She is asking about surgery which could help and I explained that with no identifiable lesion, surgery is not recommended.  She already has seen pain management who attempted [facility] which provided no relief and therefore would be difficult to even do radiofrequency ablation of a specific nerve.    - At this juncture, my recommendation is to optimize her pain regimen with medications.  Because she is pain-free in the morning, I will adjust her gabapentin to 1200mg   at noon and 6pm.  She will skip her morning dose.  If her paresthesias get worse, she can restart 800mg  in the morning.  - Continue Cymbalta 60mg  daily  - Start lidocaine ointment to her right thorac region twice daily as needed  - Consider amitriptyline or Lyrica going forward  - Encouraged her to continue back strengthening exercises  Return to clinic as needed   The duration of this appointment visit was 35 minutes  of face-to-face time with the patient.  Greater than 50% of this time was spent in counseling, explanation of diagnosis, planning of further management, and coordination of care.   Thank you for allowing me to participate in patient's care.  If I can answer any additional questions, I would be pleased to do so.    Sincerely,    Julieth Tugman K. Posey Pronto, DO

## 2016-08-25 NOTE — Patient Instructions (Addendum)
1.  Start gabapentin 1200mg  at noon and 1200mg  at 6pm 2.  Continue Cymbalta 60mg  daily 3.  Start lidocaine ointment - apply twice daily as needed.  Use with gloves or cotton swab

## 2017-09-25 ENCOUNTER — Other Ambulatory Visit: Payer: Self-pay | Admitting: Obstetrics and Gynecology

## 2017-09-25 DIAGNOSIS — Z139 Encounter for screening, unspecified: Secondary | ICD-10-CM

## 2017-10-14 ENCOUNTER — Ambulatory Visit
Admission: RE | Admit: 2017-10-14 | Discharge: 2017-10-14 | Disposition: A | Discharge status: Home / Self Care | Payer: Medicare Other | Source: Ambulatory Visit | Attending: Obstetrics and Gynecology | Admitting: Obstetrics and Gynecology

## 2017-10-14 DIAGNOSIS — Z139 Encounter for screening, unspecified: Secondary | ICD-10-CM

## 2017-12-10 ENCOUNTER — Other Ambulatory Visit: Payer: Self-pay

## 2017-12-10 ENCOUNTER — Other Ambulatory Visit: Payer: Self-pay | Admitting: Podiatry

## 2017-12-10 ENCOUNTER — Encounter: Payer: Self-pay | Admitting: Podiatry

## 2017-12-10 ENCOUNTER — Ambulatory Visit: Payer: Medicare Other | Admitting: Podiatry

## 2017-12-10 ENCOUNTER — Ambulatory Visit (INDEPENDENT_AMBULATORY_CARE_PROVIDER_SITE_OTHER): Payer: Medicare Other

## 2017-12-10 VITALS — BP 163/90 | HR 79

## 2017-12-10 DIAGNOSIS — M205X1 Other deformities of toe(s) (acquired), right foot: Secondary | ICD-10-CM

## 2017-12-10 DIAGNOSIS — M779 Enthesopathy, unspecified: Secondary | ICD-10-CM

## 2017-12-10 DIAGNOSIS — M79671 Pain in right foot: Secondary | ICD-10-CM

## 2017-12-10 MED ORDER — TRIAMCINOLONE ACETONIDE 10 MG/ML IJ SUSP
10.0000 mg | Freq: Once | INTRAMUSCULAR | Status: AC
  Administered 2017-12-10: 10 mg

## 2017-12-11 NOTE — Progress Notes (Signed)
Subjective:   Patient ID: Samantha Daugherty, female   DOB: 72 y.o.   MRN: [id number]   HPI Patient presents stating that the pain in her big toe joint of her right foot has gradually become worse over the last 6 months and she does not remember specific injury.  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  Reappoint in 1 month to evaluate how she responded to this conservative treatment  X-ray indicates bone spur on top of the first metatarsal right with moderate narrowing of the joint surface noted

## 2018-01-11 ENCOUNTER — Ambulatory Visit (INDEPENDENT_AMBULATORY_CARE_PROVIDER_SITE_OTHER): Payer: Medicare Other | Admitting: Podiatry

## 2018-01-11 ENCOUNTER — Encounter: Payer: Self-pay | Admitting: Podiatry

## 2018-01-11 DIAGNOSIS — M779 Enthesopathy, unspecified: Secondary | ICD-10-CM

## 2018-01-11 DIAGNOSIS — M205X1 Other deformities of toe(s) (acquired), right foot: Secondary | ICD-10-CM

## 2018-01-13 NOTE — Progress Notes (Signed)
Subjective:   Patient ID: Samantha Daugherty, female   DOB: 72 y.o.   MRN: [id number]   HPI Patient continues to experience discomfort states the injection did not help the big toe joint right   ROS      Objective:  Physical Exam  She is complaining of pain more at the inner phalangeal joint and at the metatarsal phalangeal joint and the range of motion is adequate but she does have moderate arthritis occurring     Assessment:  Appears to be an inflammatory condition and at this time I do not see that it appears to be a significant hallux rigidus condition discussed condition and recommended orthotics with a rigid     Plan:  Graphite extension of the right foot and patient will be seen back after getting approval for this procedure to be done and will be seen by ped orthotist

## 2018-01-18 ENCOUNTER — Telehealth: Payer: Self-pay | Admitting: Podiatry

## 2018-01-18 NOTE — Telephone Encounter (Signed)
Left voicemail for pt to call to discuss orthotic coverage. Pt has a Mining engineer.

## 2018-01-19 NOTE — Telephone Encounter (Signed)
Spoke to pt and she has [employer] and they are not covered and pt is aware of the 398.00 cost. She is going to hold off for now and think about it.

## 2018-09-17 ENCOUNTER — Other Ambulatory Visit: Payer: Self-pay | Admitting: Obstetrics and Gynecology

## 2018-09-17 DIAGNOSIS — Z1231 Encounter for screening mammogram for malignant neoplasm of breast: Secondary | ICD-10-CM

## 2018-09-20 ENCOUNTER — Other Ambulatory Visit: Payer: Self-pay | Admitting: Internal Medicine

## 2018-09-20 DIAGNOSIS — M8588 Other specified disorders of bone density and structure, other site: Secondary | ICD-10-CM

## 2018-10-27 ENCOUNTER — Ambulatory Visit: Payer: Medicare Other

## 2018-11-16 ENCOUNTER — Other Ambulatory Visit: Payer: Medicare Other

## 2019-03-01 ENCOUNTER — Other Ambulatory Visit: Payer: Self-pay

## 2019-03-01 ENCOUNTER — Ambulatory Visit
Admission: RE | Admit: 2019-03-01 | Discharge: 2019-03-01 | Disposition: A | Discharge status: Home / Self Care | Payer: Medicare Other | Source: Ambulatory Visit | Attending: Obstetrics and Gynecology | Admitting: Obstetrics and Gynecology

## 2019-03-01 ENCOUNTER — Ambulatory Visit
Admission: RE | Admit: 2019-03-01 | Discharge: 2019-03-01 | Disposition: A | Discharge status: Home / Self Care | Payer: Medicare Other | Source: Ambulatory Visit | Attending: Internal Medicine | Admitting: Internal Medicine

## 2019-03-01 DIAGNOSIS — M8588 Other specified disorders of bone density and structure, other site: Secondary | ICD-10-CM

## 2019-03-01 DIAGNOSIS — Z1231 Encounter for screening mammogram for malignant neoplasm of breast: Secondary | ICD-10-CM

## 2020-02-22 DIAGNOSIS — G4700 Insomnia, unspecified: Secondary | ICD-10-CM | POA: Diagnosis not present

## 2020-03-06 DIAGNOSIS — M1990 Unspecified osteoarthritis, unspecified site: Secondary | ICD-10-CM | POA: Diagnosis not present

## 2020-03-06 DIAGNOSIS — R030 Elevated blood-pressure reading, without diagnosis of hypertension: Secondary | ICD-10-CM | POA: Diagnosis not present

## 2020-03-06 DIAGNOSIS — G4700 Insomnia, unspecified: Secondary | ICD-10-CM | POA: Diagnosis not present

## 2020-03-06 DIAGNOSIS — G8929 Other chronic pain: Secondary | ICD-10-CM | POA: Diagnosis not present

## 2020-03-06 DIAGNOSIS — F419 Anxiety disorder, unspecified: Secondary | ICD-10-CM | POA: Diagnosis not present

## 2020-03-06 DIAGNOSIS — Z85828 Personal history of other malignant neoplasm of skin: Secondary | ICD-10-CM | POA: Diagnosis not present

## 2020-03-06 DIAGNOSIS — Z8249 Family history of ischemic heart disease and other diseases of the circulatory system: Secondary | ICD-10-CM | POA: Diagnosis not present

## 2020-03-06 DIAGNOSIS — Z809 Family history of malignant neoplasm, unspecified: Secondary | ICD-10-CM | POA: Diagnosis not present

## 2020-03-06 DIAGNOSIS — G629 Polyneuropathy, unspecified: Secondary | ICD-10-CM | POA: Diagnosis not present

## 2020-03-28 DIAGNOSIS — H40013 Open angle with borderline findings, low risk, bilateral: Secondary | ICD-10-CM | POA: Diagnosis not present

## 2020-04-10 ENCOUNTER — Other Ambulatory Visit: Payer: Self-pay | Admitting: Obstetrics and Gynecology

## 2020-04-10 DIAGNOSIS — Z0000 Encounter for general adult medical examination without abnormal findings: Secondary | ICD-10-CM

## 2020-04-17 DIAGNOSIS — Z23 Encounter for immunization: Secondary | ICD-10-CM | POA: Diagnosis not present

## 2020-04-26 ENCOUNTER — Other Ambulatory Visit: Payer: Self-pay

## 2020-04-26 ENCOUNTER — Ambulatory Visit
Admission: RE | Admit: 2020-04-26 | Discharge: 2020-04-26 | Disposition: A | Discharge status: Home / Self Care | Payer: Medicare PPO | Source: Ambulatory Visit | Attending: Obstetrics and Gynecology | Admitting: Obstetrics and Gynecology

## 2020-04-26 DIAGNOSIS — Z0000 Encounter for general adult medical examination without abnormal findings: Secondary | ICD-10-CM

## 2020-04-26 DIAGNOSIS — Z1231 Encounter for screening mammogram for malignant neoplasm of breast: Secondary | ICD-10-CM | POA: Diagnosis not present

## 2020-06-13 DIAGNOSIS — M810 Age-related osteoporosis without current pathological fracture: Secondary | ICD-10-CM | POA: Diagnosis not present

## 2020-06-13 DIAGNOSIS — F329 Major depressive disorder, single episode, unspecified: Secondary | ICD-10-CM | POA: Diagnosis not present

## 2020-06-13 DIAGNOSIS — E7800 Pure hypercholesterolemia, unspecified: Secondary | ICD-10-CM | POA: Diagnosis not present

## 2020-06-13 DIAGNOSIS — G4700 Insomnia, unspecified: Secondary | ICD-10-CM | POA: Diagnosis not present

## 2020-06-13 DIAGNOSIS — M792 Neuralgia and neuritis, unspecified: Secondary | ICD-10-CM | POA: Diagnosis not present

## 2020-07-06 DIAGNOSIS — D649 Anemia, unspecified: Secondary | ICD-10-CM | POA: Diagnosis not present

## 2020-11-23 DIAGNOSIS — H40013 Open angle with borderline findings, low risk, bilateral: Secondary | ICD-10-CM | POA: Diagnosis not present

## 2020-11-30 DIAGNOSIS — L918 Other hypertrophic disorders of the skin: Secondary | ICD-10-CM | POA: Diagnosis not present

## 2020-11-30 DIAGNOSIS — D2272 Melanocytic nevi of left lower limb, including hip: Secondary | ICD-10-CM | POA: Diagnosis not present

## 2020-11-30 DIAGNOSIS — D2271 Melanocytic nevi of right lower limb, including hip: Secondary | ICD-10-CM | POA: Diagnosis not present

## 2020-11-30 DIAGNOSIS — D225 Melanocytic nevi of trunk: Secondary | ICD-10-CM | POA: Diagnosis not present

## 2020-11-30 DIAGNOSIS — D2261 Melanocytic nevi of right upper limb, including shoulder: Secondary | ICD-10-CM | POA: Diagnosis not present

## 2020-11-30 DIAGNOSIS — L814 Other melanin hyperpigmentation: Secondary | ICD-10-CM | POA: Diagnosis not present

## 2020-11-30 DIAGNOSIS — D1801 Hemangioma of skin and subcutaneous tissue: Secondary | ICD-10-CM | POA: Diagnosis not present

## 2020-11-30 DIAGNOSIS — D2262 Melanocytic nevi of left upper limb, including shoulder: Secondary | ICD-10-CM | POA: Diagnosis not present

## 2020-11-30 DIAGNOSIS — L821 Other seborrheic keratosis: Secondary | ICD-10-CM | POA: Diagnosis not present

## 2020-12-14 DIAGNOSIS — Z79899 Other long term (current) drug therapy: Secondary | ICD-10-CM | POA: Diagnosis not present

## 2020-12-14 DIAGNOSIS — G8929 Other chronic pain: Secondary | ICD-10-CM | POA: Diagnosis not present

## 2020-12-14 DIAGNOSIS — Z0000 Encounter for general adult medical examination without abnormal findings: Secondary | ICD-10-CM | POA: Diagnosis not present

## 2020-12-14 DIAGNOSIS — G479 Sleep disorder, unspecified: Secondary | ICD-10-CM | POA: Diagnosis not present

## 2020-12-14 DIAGNOSIS — E7800 Pure hypercholesterolemia, unspecified: Secondary | ICD-10-CM | POA: Diagnosis not present

## 2020-12-14 DIAGNOSIS — M8589 Other specified disorders of bone density and structure, multiple sites: Secondary | ICD-10-CM | POA: Diagnosis not present

## 2020-12-14 DIAGNOSIS — Z1211 Encounter for screening for malignant neoplasm of colon: Secondary | ICD-10-CM | POA: Diagnosis not present

## 2020-12-17 DIAGNOSIS — Z1211 Encounter for screening for malignant neoplasm of colon: Secondary | ICD-10-CM | POA: Diagnosis not present

## 2020-12-18 ENCOUNTER — Other Ambulatory Visit: Payer: Self-pay | Admitting: Family Medicine

## 2020-12-18 DIAGNOSIS — M810 Age-related osteoporosis without current pathological fracture: Secondary | ICD-10-CM

## 2021-02-12 DIAGNOSIS — Z23 Encounter for immunization: Secondary | ICD-10-CM | POA: Diagnosis not present

## 2021-02-14 ENCOUNTER — Encounter: Payer: Self-pay | Admitting: Gastroenterology

## 2021-02-26 ENCOUNTER — Encounter: Payer: Self-pay | Admitting: Gastroenterology

## 2021-04-19 ENCOUNTER — Encounter: Payer: Self-pay | Admitting: Gastroenterology

## 2021-04-19 ENCOUNTER — Ambulatory Visit (AMBULATORY_SURGERY_CENTER): Payer: Self-pay | Admitting: *Deleted

## 2021-04-19 ENCOUNTER — Other Ambulatory Visit: Payer: Self-pay

## 2021-04-19 VITALS — Ht 63.0 in | Wt 122.0 lb

## 2021-04-19 DIAGNOSIS — Z1211 Encounter for screening for malignant neoplasm of colon: Secondary | ICD-10-CM

## 2021-04-19 MED ORDER — NA SULFATE-K SULFATE-MG SULF 17.5-3.13-1.6 GM/177ML PO SOLN: 1.0000 | ORAL | 0 refills | Status: DC

## 2021-04-19 NOTE — Progress Notes (Signed)
Patient is here in-person for PV. Patient denies any allergies to eggs or soy. Patient denies any problems with anesthesia/sedation. Patient is not on any oxygen at home. Patient is not taking any diet/weight loss medications or blood thinners. Patient is aware of our care-partner policy and IEPPI-95 safety protocol.   EMMI education assigned to the patient for the procedure, sent to [place].

## 2021-04-24 DIAGNOSIS — Z23 Encounter for immunization: Secondary | ICD-10-CM | POA: Diagnosis not present

## 2021-05-02 ENCOUNTER — Ambulatory Visit (AMBULATORY_SURGERY_CENTER): Payer: Medicare PPO | Admitting: Gastroenterology

## 2021-05-02 ENCOUNTER — Other Ambulatory Visit: Payer: Self-pay

## 2021-05-02 ENCOUNTER — Encounter: Payer: Self-pay | Admitting: Gastroenterology

## 2021-05-02 VITALS — BP 130/72 | HR 67 | Temp 96.6°F | Resp 18 | Ht 63.0 in | Wt 122.0 lb

## 2021-05-02 DIAGNOSIS — D122 Benign neoplasm of ascending colon: Secondary | ICD-10-CM | POA: Diagnosis not present

## 2021-05-02 DIAGNOSIS — Z1211 Encounter for screening for malignant neoplasm of colon: Secondary | ICD-10-CM

## 2021-05-02 DIAGNOSIS — G479 Sleep disorder, unspecified: Secondary | ICD-10-CM | POA: Diagnosis not present

## 2021-05-02 DIAGNOSIS — F419 Anxiety disorder, unspecified: Secondary | ICD-10-CM | POA: Diagnosis not present

## 2021-05-02 DIAGNOSIS — F329 Major depressive disorder, single episode, unspecified: Secondary | ICD-10-CM | POA: Diagnosis not present

## 2021-05-02 MED ORDER — SODIUM CHLORIDE 0.9 % IV SOLN: 500.0000 mL | Freq: Once | INTRAVENOUS | Status: DC

## 2021-05-02 NOTE — Op Note (Signed)
[facility] Patient Name: Samantha Daugherty Procedure Date: 05/02/2021 10:44 AM MRN: [id number] Endoscopist: Ladene Artist , MD Age: 75 Referring MD:  Date of Birth: [birth year] Gender: Female Account #: [id number] Procedure:                Colonoscopy Indications:              Screening for colorectal malignant neoplasm Medicines:                Monitored Anesthesia Care Procedure:                Pre-Anesthesia Assessment:                           - Prior to the procedure, a History and Physical                            was performed, and patient medications and                            allergies were reviewed. The patient's tolerance of                            previous anesthesia was also reviewed. The risks                            and benefits of the procedure and the sedation                            options and risks were discussed with the patient.                            All questions were answered, and informed consent                            was obtained. Prior Anticoagulants: The patient has                            taken no previous anticoagulant or antiplatelet                            agents. ASA Grade Assessment: II - A patient with                            mild systemic disease. After reviewing the risks                            and benefits, the patient was deemed in                            satisfactory condition to undergo the procedure.                           After obtaining informed consent, the colonoscope  was passed under direct vision. Throughout the                            procedure, the patient's blood pressure, pulse, and                            oxygen saturations were monitored continuously. The                            Olympus PCF-H190DL ([id number]) Colonoscope was                            introduced through the anus and advanced to the the                            cecum,  identified by appendiceal orifice and                            ileocecal valve. The ileocecal valve, appendiceal                            orifice, and rectum were photographed. The quality                            of the bowel preparation was good. The colonoscopy                            was somewhat difficult due to significant looping                            and a tortuous colon. Successful completion of the                            procedure was aided by straightening and shortening                            the scope to obtain bowel loop reduction and using                            scope torsion. The patient tolerated the procedure                            well. Scope In: 10:55:31 AM Scope Out: 11:15:54 AM Scope Withdrawal Time: 0 hours 13 minutes 7 seconds  Total Procedure Duration: 0 hours 20 minutes 23 seconds  Findings:                 The perianal and digital rectal examinations were                            normal.                           Two sessile polyps were found in the ascending  colon. The polyps were 4 to 6 mm in size. These                            polyps were removed with a cold snare. Resection                            and retrieval were complete.                           Internal hemorrhoids were found during                            retroflexion. The hemorrhoids were moderate and                            Grade I (internal hemorrhoids that do not prolapse).                           The exam was otherwise without abnormality on                            direct and retroflexion views. Complications:            No immediate complications. Estimated blood loss:                            None. Estimated Blood Loss:     Estimated blood loss: none. Impression:               - Two 4 to 6 mm polyps in the ascending colon,                            removed with a cold snare. Resected and retrieved.                            - Internal hemorrhoids.                           - The examination was otherwise normal on direct                            and retroflexion views. Recommendation:           - Repeat colonoscopy after studies are complete for                            surveillance based on pathology results.                           - Patient has a contact number available for                            emergencies. The signs and symptoms of potential                            delayed complications  were discussed with the                            patient. Return to normal activities tomorrow.                            Written discharge instructions were provided to the                            patient.                           - Resume previous diet.                           - Continue present medications.                           - Await pathology results. Ladene Artist, MD 05/02/2021 11:19:33 AM This report has been signed electronically.

## 2021-05-02 NOTE — Progress Notes (Signed)
Called to room to assist during endoscopic procedure.  Patient ID and intended procedure confirmed with present staff. Received instructions for my participation in the procedure from the performing physician.  

## 2021-05-02 NOTE — Progress Notes (Signed)
Report to PACU, RN, vss, BBS= Clear.  

## 2021-05-02 NOTE — Progress Notes (Signed)
History & Physical  Primary Care Physician:  Glenis Smoker, MD Primary Gastroenterologist: Jerilynn Mages. Fuller Plan, MD  CHIEF COMPLAINT:  CRC screening  HPI: Samantha Daugherty is a 75 y.o. female, average risk for CRC, who presents for screening colonoscopy.    Past Medical History:  Diagnosis Date   Anxiety    Fibrocystic breast    Meniere disease    Osteopenia    Raynaud's syndrome    Sinus problem    Sleep disorder    Thoracic neuralgia     Past Surgical History:  Procedure Laterality Date   BREAST EXCISIONAL BIOPSY Right 2010   COLONOSCOPY     EYE SURGERY Bilateral    KNEE SURGERY     PARTIAL HYSTERECTOMY  1997   TONSILLECTOMY AND ADENOIDECTOMY     UPPER GASTROINTESTINAL ENDOSCOPY      Prior to Admission medications   Medication Sig Start Date End Date Taking? Authorizing Provider  ALPRAZolam Duanne Moron) 0.25 MG tablet Take 0.25 mg by mouth at bedtime as needed for anxiety.   Yes [provider]  Ascorbic Acid (VITAMIN C) 1000 MG tablet Take 1,000 mg by mouth.   Yes [provider]  B Complex Vitamins (VITAMIN-B COMPLEX) TABS Take by mouth.   Yes [provider]  Calcium Carbonate-Vit D-Min (CALCIUM 600+D PLUS MINERALS) 600-400 MG-UNIT TABS Take by mouth.   Yes [provider]  carisoprodol (SOMA) 350 MG tablet Take 350 mg by mouth 4 (four) times daily as needed for muscle spasms.   Yes [provider]  Cholecalciferol (VITAMIN D3 PO) Take by mouth.   Yes [provider]  DULoxetine (CYMBALTA) 60 MG capsule Take 60 mg by mouth daily. 05/18/16  Yes [provider]  fluticasone (FLONASE) 50 MCG/ACT nasal spray Place into both nostrils daily.   Yes [provider]  gabapentin (NEURONTIN) 400 MG capsule Take 3 tablets at noon and 3 tablets at 6pm. 08/25/16  Yes Patel, Donika K, DO  Melatonin 10 MG CAPS Take by mouth.   Yes [provider]  Multiple Vitamin (MULTIVITAMIN) capsule Take 1 capsule by mouth  daily.   Yes [provider]  Omega-3 Fatty Acids (FISH OIL PO) Take by mouth.   Yes [provider]  Potassium 99 MG TABS Take by mouth.   Yes [provider]  traZODone (DESYREL) 50 MG tablet Take 50 mg by mouth at bedtime.   Yes [provider]  vitamin E 400 UNIT capsule Take by mouth.   Yes [provider]  zinc gluconate 50 MG tablet Take 50 mg by mouth daily.   Yes [provider]    Current Outpatient Medications  Medication Sig Dispense Refill   ALPRAZolam (XANAX) 0.25 MG tablet Take 0.25 mg by mouth at bedtime as needed for anxiety.     Ascorbic Acid (VITAMIN C) 1000 MG tablet Take 1,000 mg by mouth.     B Complex Vitamins (VITAMIN-B COMPLEX) TABS Take by mouth.     Calcium Carbonate-Vit D-Min (CALCIUM 600+D PLUS MINERALS) 600-400 MG-UNIT TABS Take by mouth.     carisoprodol (SOMA) 350 MG tablet Take 350 mg by mouth 4 (four) times daily as needed for muscle spasms.     Cholecalciferol (VITAMIN D3 PO) Take by mouth.     DULoxetine (CYMBALTA) 60 MG capsule Take 60 mg by mouth daily.  3   fluticasone (FLONASE) 50 MCG/ACT nasal spray Place into both nostrils daily.     gabapentin (NEURONTIN) 400 MG  capsule Take 3 tablets at noon and 3 tablets at 6pm. 180 capsule 0   Melatonin 10 MG CAPS Take by mouth.     Multiple Vitamin (MULTIVITAMIN) capsule Take 1 capsule by mouth daily.     Omega-3 Fatty Acids (FISH OIL PO) Take by mouth.     Potassium 99 MG TABS Take by mouth.     traZODone (DESYREL) 50 MG tablet Take 50 mg by mouth at bedtime.     vitamin E 400 UNIT capsule Take by mouth.     zinc gluconate 50 MG tablet Take 50 mg by mouth daily.     Current Facility-Administered Medications  Medication Dose Route Frequency Provider Last Rate Last Admin   0.9 %  sodium chloride infusion  500 mL Intravenous Once Ladene Artist, MD        Allergies as of 05/02/2021 - Review Complete 05/02/2021  Allergen Reaction Noted   Other   04/19/2021    Family History  Problem Relation Age of Onset   Colon cancer Neg Hx    Colon polyps Neg Hx    Esophageal cancer Neg Hx    Rectal cancer Neg Hx    Stomach cancer Neg Hx     Social History   Socioeconomic History   Marital status: Married    Spouse name: Not on file   Number of children: 3   Years of education: Not on file   Highest education level: Not on file  Occupational History   Not on file  Tobacco Use   Smoking status: Never   Smokeless tobacco: Never  Vaping Use   Vaping Use: Never used  Substance and Sexual Activity   Alcohol use: Yes    Comment: once a month per pt   Drug use: No   Sexual activity: Not on file  Other Topics Concern   Not on file  Social History Narrative   Lives with husband.  Has 3 children.  Retired Pharmacist, hospital.  Education: college.    Social Determinants of Health   Financial Resource Strain: Not on file  Food Insecurity: Not on file  Transportation Needs: Not on file  Physical Activity: Not on file  Stress: Not on file  Social Connections: Not on file  Intimate Partner Violence: Not on file    Review of Systems:  All systems reviewed an negative except where noted in HPI.  Gen: Denies any fever, chills, sweats, anorexia, fatigue, weakness, malaise, weight loss, and sleep disorder CV: Denies chest pain, angina, palpitations, syncope, orthopnea, PND, peripheral edema, and claudication. Resp: Denies dyspnea at rest, dyspnea with exercise, cough, sputum, wheezing, coughing up blood, and pleurisy. GI: Denies vomiting blood, jaundice, and fecal incontinence.   Denies dysphagia or odynophagia. GU : Denies urinary burning, blood in urine, urinary frequency, urinary hesitancy, nocturnal urination, and urinary incontinence. MS: Denies joint pain, limitation of movement, and swelling, stiffness, low back pain, extremity pain. Denies muscle weakness, cramps, atrophy.  Derm: Denies rash, itching, dry skin, hives, moles, warts, or  unhealing ulcers.  Psych: Denies depression, anxiety, memory loss, suicidal ideation, hallucinations, paranoia, and confusion. Heme: Denies bruising, bleeding, and enlarged lymph nodes. Neuro:  Denies any headaches, dizziness, paresthesias. Endo:  Denies any problems with DM, thyroid, adrenal function.   Physical Exam: General:  Alert, well-developed, in NAD Head:  Normocephalic and atraumatic. Eyes:  Sclera clear, no icterus.   Conjunctiva pink. Ears:  Normal auditory acuity. Mouth:  No deformity or lesions.  Neck:  Supple; no masses .  Lungs:  Clear throughout to auscultation.   No wheezes, crackles, or rhonchi. No acute distress. Heart:  Regular rate and rhythm; no murmurs. Abdomen:  Soft, nondistended, nontender. No masses, hepatomegaly. No obvious masses.  Normal bowel .    Rectal:  Deferred   Msk:  Symmetrical without gross deformities.. Pulses:  Normal pulses noted. Extremities:  Without edema. Neurologic:  Alert and  oriented x4;  grossly normal neurologically. Skin:  Intact without significant lesions or rashes. Cervical Nodes:  No significant cervical adenopathy. Psych:  Alert and cooperative. Normal mood and affect.   Impression / Plan:   CRC screening, average risk, for colonoscopy.   This patient is appropriate for endoscopic procedures in the ambulatory setting.    Pricilla Riffle. Fuller Plan  05/02/2021, 10:52 AM

## 2021-05-02 NOTE — Progress Notes (Signed)
Patient will not wake up fully. Patient is being held in recovery room.

## 2021-05-02 NOTE — Progress Notes (Signed)
VS completed by DT.  Pt's states no medical or surgical changes since previsit or office visit.  

## 2021-05-02 NOTE — Patient Instructions (Signed)
Read all of the handouts given to you by your recovery room nurse. ? ?YOU HAD AN ENDOSCOPIC PROCEDURE TODAY AT THE [facility]:   Refer to the procedure report that was given to you for any specific questions about what was found during the examination.  If the procedure report does not answer your questions, please call your gastroenterologist to clarify.  If you requested that your care partner not be given the details of your procedure findings, then the procedure report has been included in a sealed envelope for you to review at your convenience later. ? ?YOU SHOULD EXPECT: Some feelings of bloating in the abdomen. Passage of more gas than usual.  Walking can help get rid of the air that was put into your GI tract during the procedure and reduce the bloating. If you had a lower endoscopy (such as a colonoscopy or flexible sigmoidoscopy) you may notice spotting of blood in your stool or on the toilet paper. If you underwent a bowel prep for your procedure, you may not have a normal bowel movement for a few days. ? ?Please Note:  You might notice some irritation and congestion in your nose or some drainage.  This is from the oxygen used during your procedure.  There is no need for concern and it should clear up in a day or so. ? ?SYMPTOMS TO REPORT IMMEDIATELY: ? ?Following lower endoscopy (colonoscopy or flexible sigmoidoscopy): ? Excessive amounts of blood in the stool ? Significant tenderness or worsening of abdominal pains ? Swelling of the abdomen that is new, acute ? Fever of 100?F or higher ? ?  ?For urgent or emergent issues, a gastroenterologist can be reached at any hour by calling [phone]. ?Do not use MyChart messaging for urgent concerns.  ? ? ?DIET:  We do recommend a small meal at first, but then you may proceed to your regular diet.  Drink plenty of fluids but you should avoid alcoholic beverages for 24 hours. ? ?ACTIVITY:  You should plan to take it easy for the rest of today  and you should NOT DRIVE or use heavy machinery until tomorrow (because of the sedation medicines used during the test).   ? ?FOLLOW UP: ?Our staff will call the number listed on your records 48-72 hours following your procedure to check on you and address any questions or concerns that you may have regarding the information given to you following your procedure. If we do not reach you, we will leave a message.  We will attempt to reach you two times.  During this call, we will ask if you have developed any symptoms of COVID 19. If you develop any symptoms (ie: fever, flu-like symptoms, shortness of breath, cough etc.) before then, please call [phone].  If you test positive for Covid 19 in the 2 weeks post procedure, please call and report this information to us.   ? ?If any biopsies were taken you will be contacted by phone or by letter within the next 1-3 weeks.  Please call us at [phone] if you have not heard about the biopsies in 3 weeks.  ? ? ?SIGNATURES/CONFIDENTIALITY: ?You and/or your care partner have signed paperwork which will be entered into your electronic medical record.  These signatures attest to the fact that that the information above on your After Visit Summary has been reviewed and is understood.  Full responsibility of the confidentiality of this discharge information lies with you and/or your care-partner.  ?

## 2021-05-06 ENCOUNTER — Telehealth: Payer: Self-pay | Admitting: *Deleted

## 2021-05-06 ENCOUNTER — Telehealth: Payer: Self-pay

## 2021-05-06 NOTE — Telephone Encounter (Signed)
Message left

## 2021-05-06 NOTE — Telephone Encounter (Signed)
No answer, left message to call back if having any issues or concerns, B.Shuronda Santino RN. 

## 2021-05-21 DIAGNOSIS — F419 Anxiety disorder, unspecified: Secondary | ICD-10-CM | POA: Diagnosis not present

## 2021-05-21 DIAGNOSIS — G8929 Other chronic pain: Secondary | ICD-10-CM | POA: Diagnosis not present

## 2021-05-21 DIAGNOSIS — R0981 Nasal congestion: Secondary | ICD-10-CM | POA: Diagnosis not present

## 2021-05-21 DIAGNOSIS — R030 Elevated blood-pressure reading, without diagnosis of hypertension: Secondary | ICD-10-CM | POA: Diagnosis not present

## 2021-05-21 DIAGNOSIS — G629 Polyneuropathy, unspecified: Secondary | ICD-10-CM | POA: Diagnosis not present

## 2021-05-21 DIAGNOSIS — G4700 Insomnia, unspecified: Secondary | ICD-10-CM | POA: Diagnosis not present

## 2021-05-21 DIAGNOSIS — M1990 Unspecified osteoarthritis, unspecified site: Secondary | ICD-10-CM | POA: Diagnosis not present

## 2021-05-21 DIAGNOSIS — Z818 Family history of other mental and behavioral disorders: Secondary | ICD-10-CM | POA: Diagnosis not present

## 2021-05-21 DIAGNOSIS — H04129 Dry eye syndrome of unspecified lacrimal gland: Secondary | ICD-10-CM | POA: Diagnosis not present

## 2021-05-28 ENCOUNTER — Encounter: Payer: Self-pay | Admitting: Gastroenterology

## 2021-06-18 DIAGNOSIS — E7800 Pure hypercholesterolemia, unspecified: Secondary | ICD-10-CM | POA: Diagnosis not present

## 2021-06-18 DIAGNOSIS — G479 Sleep disorder, unspecified: Secondary | ICD-10-CM | POA: Diagnosis not present

## 2021-06-18 DIAGNOSIS — J3489 Other specified disorders of nose and nasal sinuses: Secondary | ICD-10-CM | POA: Diagnosis not present

## 2021-06-21 DIAGNOSIS — H524 Presbyopia: Secondary | ICD-10-CM | POA: Diagnosis not present

## 2021-06-21 DIAGNOSIS — H40013 Open angle with borderline findings, low risk, bilateral: Secondary | ICD-10-CM | POA: Diagnosis not present

## 2021-07-01 ENCOUNTER — Ambulatory Visit
Admission: RE | Admit: 2021-07-01 | Discharge: 2021-07-01 | Disposition: A | Discharge status: Home / Self Care | Payer: Medicare PPO | Source: Ambulatory Visit | Attending: Family Medicine | Admitting: Family Medicine

## 2021-07-01 DIAGNOSIS — Z780 Asymptomatic menopausal state: Secondary | ICD-10-CM | POA: Diagnosis not present

## 2021-07-01 DIAGNOSIS — M8589 Other specified disorders of bone density and structure, multiple sites: Secondary | ICD-10-CM | POA: Diagnosis not present

## 2021-07-01 DIAGNOSIS — M810 Age-related osteoporosis without current pathological fracture: Secondary | ICD-10-CM

## 2021-08-07 DIAGNOSIS — J3489 Other specified disorders of nose and nasal sinuses: Secondary | ICD-10-CM | POA: Diagnosis not present

## 2021-08-07 DIAGNOSIS — R0981 Nasal congestion: Secondary | ICD-10-CM | POA: Diagnosis not present

## 2021-09-10 DIAGNOSIS — L710 Perioral dermatitis: Secondary | ICD-10-CM | POA: Diagnosis not present

## 2021-12-13 DIAGNOSIS — Z79899 Other long term (current) drug therapy: Secondary | ICD-10-CM | POA: Diagnosis not present

## 2021-12-13 DIAGNOSIS — M810 Age-related osteoporosis without current pathological fracture: Secondary | ICD-10-CM | POA: Diagnosis not present

## 2021-12-13 DIAGNOSIS — E7800 Pure hypercholesterolemia, unspecified: Secondary | ICD-10-CM | POA: Diagnosis not present

## 2021-12-16 DIAGNOSIS — Z1211 Encounter for screening for malignant neoplasm of colon: Secondary | ICD-10-CM | POA: Diagnosis not present

## 2021-12-17 ENCOUNTER — Other Ambulatory Visit: Payer: Self-pay | Admitting: Obstetrics and Gynecology

## 2021-12-17 DIAGNOSIS — Z0000 Encounter for general adult medical examination without abnormal findings: Secondary | ICD-10-CM | POA: Diagnosis not present

## 2021-12-17 DIAGNOSIS — Z79899 Other long term (current) drug therapy: Secondary | ICD-10-CM | POA: Diagnosis not present

## 2021-12-17 DIAGNOSIS — E7800 Pure hypercholesterolemia, unspecified: Secondary | ICD-10-CM | POA: Diagnosis not present

## 2021-12-17 DIAGNOSIS — G479 Sleep disorder, unspecified: Secondary | ICD-10-CM | POA: Diagnosis not present

## 2021-12-17 DIAGNOSIS — M5414 Radiculopathy, thoracic region: Secondary | ICD-10-CM | POA: Diagnosis not present

## 2021-12-17 DIAGNOSIS — M8589 Other specified disorders of bone density and structure, multiple sites: Secondary | ICD-10-CM | POA: Diagnosis not present

## 2021-12-17 DIAGNOSIS — Z1231 Encounter for screening mammogram for malignant neoplasm of breast: Secondary | ICD-10-CM

## 2021-12-17 DIAGNOSIS — G8929 Other chronic pain: Secondary | ICD-10-CM | POA: Diagnosis not present

## 2021-12-17 DIAGNOSIS — Z1211 Encounter for screening for malignant neoplasm of colon: Secondary | ICD-10-CM | POA: Diagnosis not present

## 2021-12-17 DIAGNOSIS — M810 Age-related osteoporosis without current pathological fracture: Secondary | ICD-10-CM | POA: Diagnosis not present

## 2021-12-18 DIAGNOSIS — L812 Freckles: Secondary | ICD-10-CM | POA: Diagnosis not present

## 2021-12-18 DIAGNOSIS — D225 Melanocytic nevi of trunk: Secondary | ICD-10-CM | POA: Diagnosis not present

## 2021-12-18 DIAGNOSIS — L821 Other seborrheic keratosis: Secondary | ICD-10-CM | POA: Diagnosis not present

## 2021-12-19 ENCOUNTER — Other Ambulatory Visit (HOSPITAL_BASED_OUTPATIENT_CLINIC_OR_DEPARTMENT_OTHER): Payer: Self-pay | Admitting: Family Medicine

## 2021-12-19 DIAGNOSIS — E7800 Pure hypercholesterolemia, unspecified: Secondary | ICD-10-CM

## 2021-12-23 DIAGNOSIS — H04123 Dry eye syndrome of bilateral lacrimal glands: Secondary | ICD-10-CM | POA: Diagnosis not present

## 2021-12-23 DIAGNOSIS — H35363 Drusen (degenerative) of macula, bilateral: Secondary | ICD-10-CM | POA: Diagnosis not present

## 2021-12-23 DIAGNOSIS — H40013 Open angle with borderline findings, low risk, bilateral: Secondary | ICD-10-CM | POA: Diagnosis not present

## 2021-12-23 DIAGNOSIS — H35033 Hypertensive retinopathy, bilateral: Secondary | ICD-10-CM | POA: Diagnosis not present

## 2021-12-24 DIAGNOSIS — B9789 Other viral agents as the cause of diseases classified elsewhere: Secondary | ICD-10-CM | POA: Diagnosis not present

## 2021-12-24 DIAGNOSIS — J028 Acute pharyngitis due to other specified organisms: Secondary | ICD-10-CM | POA: Diagnosis not present

## 2021-12-31 DIAGNOSIS — Z01419 Encounter for gynecological examination (general) (routine) without abnormal findings: Secondary | ICD-10-CM | POA: Diagnosis not present

## 2022-01-01 ENCOUNTER — Ambulatory Visit
Admission: RE | Admit: 2022-01-01 | Discharge: 2022-01-01 | Disposition: A | Discharge status: Home / Self Care | Payer: Medicare PPO | Source: Ambulatory Visit | Attending: Obstetrics and Gynecology | Admitting: Obstetrics and Gynecology

## 2022-01-01 DIAGNOSIS — Z1231 Encounter for screening mammogram for malignant neoplasm of breast: Secondary | ICD-10-CM

## 2022-01-13 ENCOUNTER — Ambulatory Visit (HOSPITAL_BASED_OUTPATIENT_CLINIC_OR_DEPARTMENT_OTHER)
Admission: RE | Admit: 2022-01-13 | Discharge: 2022-01-13 | Disposition: A | Discharge status: Home / Self Care | Payer: Medicare PPO | Source: Ambulatory Visit | Attending: Family Medicine | Admitting: Family Medicine

## 2022-01-13 DIAGNOSIS — E7800 Pure hypercholesterolemia, unspecified: Secondary | ICD-10-CM | POA: Insufficient documentation

## 2022-01-24 ENCOUNTER — Other Ambulatory Visit: Payer: Self-pay | Admitting: Family Medicine

## 2022-01-24 DIAGNOSIS — R911 Solitary pulmonary nodule: Secondary | ICD-10-CM

## 2022-02-03 ENCOUNTER — Other Ambulatory Visit (HOSPITAL_BASED_OUTPATIENT_CLINIC_OR_DEPARTMENT_OTHER): Payer: Self-pay | Admitting: Family Medicine

## 2022-02-03 DIAGNOSIS — R911 Solitary pulmonary nodule: Secondary | ICD-10-CM

## 2022-04-09 ENCOUNTER — Ambulatory Visit (HOSPITAL_BASED_OUTPATIENT_CLINIC_OR_DEPARTMENT_OTHER)
Admission: RE | Admit: 2022-04-09 | Discharge: 2022-04-09 | Disposition: A | Discharge status: Home / Self Care | Payer: Medicare PPO | Source: Ambulatory Visit | Attending: Family Medicine | Admitting: Family Medicine

## 2022-04-09 ENCOUNTER — Encounter (HOSPITAL_BASED_OUTPATIENT_CLINIC_OR_DEPARTMENT_OTHER): Payer: Self-pay

## 2022-04-09 DIAGNOSIS — R918 Other nonspecific abnormal finding of lung field: Secondary | ICD-10-CM | POA: Diagnosis not present

## 2022-04-09 DIAGNOSIS — R911 Solitary pulmonary nodule: Secondary | ICD-10-CM | POA: Insufficient documentation

## 2022-04-16 DIAGNOSIS — E785 Hyperlipidemia, unspecified: Secondary | ICD-10-CM | POA: Diagnosis not present

## 2022-04-21 ENCOUNTER — Other Ambulatory Visit: Payer: Medicare PPO

## 2022-06-20 DIAGNOSIS — Z23 Encounter for immunization: Secondary | ICD-10-CM | POA: Diagnosis not present

## 2022-06-20 DIAGNOSIS — M5414 Radiculopathy, thoracic region: Secondary | ICD-10-CM | POA: Diagnosis not present

## 2022-06-20 DIAGNOSIS — E7800 Pure hypercholesterolemia, unspecified: Secondary | ICD-10-CM | POA: Diagnosis not present

## 2022-06-20 DIAGNOSIS — R252 Cramp and spasm: Secondary | ICD-10-CM | POA: Diagnosis not present

## 2022-06-20 DIAGNOSIS — E559 Vitamin D deficiency, unspecified: Secondary | ICD-10-CM | POA: Diagnosis not present

## 2022-07-10 DIAGNOSIS — H40013 Open angle with borderline findings, low risk, bilateral: Secondary | ICD-10-CM | POA: Diagnosis not present

## 2022-11-18 ENCOUNTER — Other Ambulatory Visit: Payer: Self-pay | Admitting: Obstetrics and Gynecology

## 2022-11-18 DIAGNOSIS — Z1231 Encounter for screening mammogram for malignant neoplasm of breast: Secondary | ICD-10-CM

## 2022-12-19 DIAGNOSIS — Z79899 Other long term (current) drug therapy: Secondary | ICD-10-CM | POA: Diagnosis not present

## 2022-12-19 DIAGNOSIS — E559 Vitamin D deficiency, unspecified: Secondary | ICD-10-CM | POA: Diagnosis not present

## 2022-12-19 DIAGNOSIS — E785 Hyperlipidemia, unspecified: Secondary | ICD-10-CM | POA: Diagnosis not present

## 2022-12-19 DIAGNOSIS — Z1211 Encounter for screening for malignant neoplasm of colon: Secondary | ICD-10-CM | POA: Diagnosis not present

## 2022-12-23 DIAGNOSIS — G8929 Other chronic pain: Secondary | ICD-10-CM | POA: Diagnosis not present

## 2022-12-23 DIAGNOSIS — E785 Hyperlipidemia, unspecified: Secondary | ICD-10-CM | POA: Diagnosis not present

## 2022-12-23 DIAGNOSIS — Z23 Encounter for immunization: Secondary | ICD-10-CM | POA: Diagnosis not present

## 2022-12-23 DIAGNOSIS — G479 Sleep disorder, unspecified: Secondary | ICD-10-CM | POA: Diagnosis not present

## 2022-12-23 DIAGNOSIS — Z0000 Encounter for general adult medical examination without abnormal findings: Secondary | ICD-10-CM | POA: Diagnosis not present

## 2022-12-23 DIAGNOSIS — E7800 Pure hypercholesterolemia, unspecified: Secondary | ICD-10-CM | POA: Diagnosis not present

## 2022-12-23 DIAGNOSIS — I700 Atherosclerosis of aorta: Secondary | ICD-10-CM | POA: Diagnosis not present

## 2022-12-23 DIAGNOSIS — Z79899 Other long term (current) drug therapy: Secondary | ICD-10-CM | POA: Diagnosis not present

## 2022-12-23 DIAGNOSIS — E559 Vitamin D deficiency, unspecified: Secondary | ICD-10-CM | POA: Diagnosis not present

## 2023-01-05 ENCOUNTER — Ambulatory Visit
Admission: RE | Admit: 2023-01-05 | Discharge: 2023-01-05 | Disposition: A | Discharge status: Home / Self Care | Payer: Medicare PPO | Source: Ambulatory Visit | Attending: Obstetrics and Gynecology | Admitting: Obstetrics and Gynecology

## 2023-01-05 DIAGNOSIS — Z1231 Encounter for screening mammogram for malignant neoplasm of breast: Secondary | ICD-10-CM

## 2023-01-14 DIAGNOSIS — K130 Diseases of lips: Secondary | ICD-10-CM | POA: Diagnosis not present

## 2023-01-14 DIAGNOSIS — D1801 Hemangioma of skin and subcutaneous tissue: Secondary | ICD-10-CM | POA: Diagnosis not present

## 2023-01-14 DIAGNOSIS — L821 Other seborrheic keratosis: Secondary | ICD-10-CM | POA: Diagnosis not present

## 2023-01-15 DIAGNOSIS — H35372 Puckering of macula, left eye: Secondary | ICD-10-CM | POA: Diagnosis not present

## 2023-01-15 DIAGNOSIS — H35363 Drusen (degenerative) of macula, bilateral: Secondary | ICD-10-CM | POA: Diagnosis not present

## 2023-01-15 DIAGNOSIS — H35033 Hypertensive retinopathy, bilateral: Secondary | ICD-10-CM | POA: Diagnosis not present

## 2023-01-15 DIAGNOSIS — H40013 Open angle with borderline findings, low risk, bilateral: Secondary | ICD-10-CM | POA: Diagnosis not present

## 2023-01-17 DIAGNOSIS — M8580 Other specified disorders of bone density and structure, unspecified site: Secondary | ICD-10-CM | POA: Diagnosis not present

## 2023-01-17 DIAGNOSIS — M62838 Other muscle spasm: Secondary | ICD-10-CM | POA: Diagnosis not present

## 2023-01-17 DIAGNOSIS — Z59819 Housing instability, housed unspecified: Secondary | ICD-10-CM | POA: Diagnosis not present

## 2023-01-17 DIAGNOSIS — F419 Anxiety disorder, unspecified: Secondary | ICD-10-CM | POA: Diagnosis not present

## 2023-01-17 DIAGNOSIS — G4700 Insomnia, unspecified: Secondary | ICD-10-CM | POA: Diagnosis not present

## 2023-01-17 DIAGNOSIS — Z91013 Allergy to seafood: Secondary | ICD-10-CM | POA: Diagnosis not present

## 2023-01-17 DIAGNOSIS — E785 Hyperlipidemia, unspecified: Secondary | ICD-10-CM | POA: Diagnosis not present

## 2023-01-17 DIAGNOSIS — G543 Thoracic root disorders, not elsewhere classified: Secondary | ICD-10-CM | POA: Diagnosis not present

## 2023-02-02 DIAGNOSIS — I10 Essential (primary) hypertension: Secondary | ICD-10-CM | POA: Diagnosis not present

## 2023-02-02 DIAGNOSIS — R0981 Nasal congestion: Secondary | ICD-10-CM | POA: Diagnosis not present

## 2023-03-08 IMAGING — CT CT CARDIAC CORONARY ARTERY CALCIUM SCORE
3 series · 13 of 20 positions shown, 15 images · non-contrast
Comparison: None.

Addendum:
CLINICAL DATA: Cardiovascular Disease Risk stratification

EXAM:
Coronary Calcium Score
TECHNIQUE: A gated, non-contrast computed tomography scan of the heart was
performed using 3mm slice thickness. Axial images were analyzed on a
dedicated workstation. Calcium scoring of the coronary arteries was
performed using the Agatston method.

[Series 2: ax lung · axial · 0.68mm/px · z∈[+1012,+1124]mm · 5 of 86 slices shown]
[im 15/86  lung]
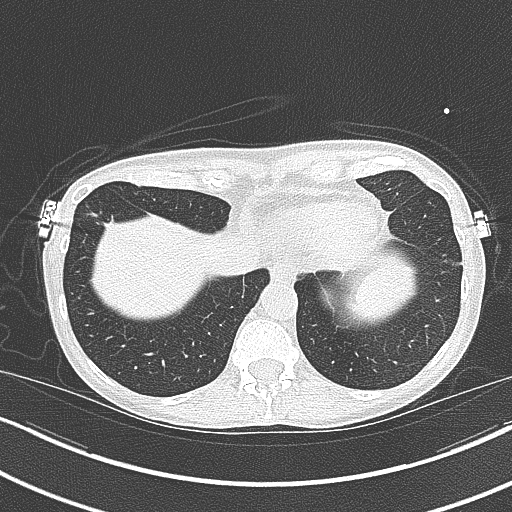
[im 29/86  lung]
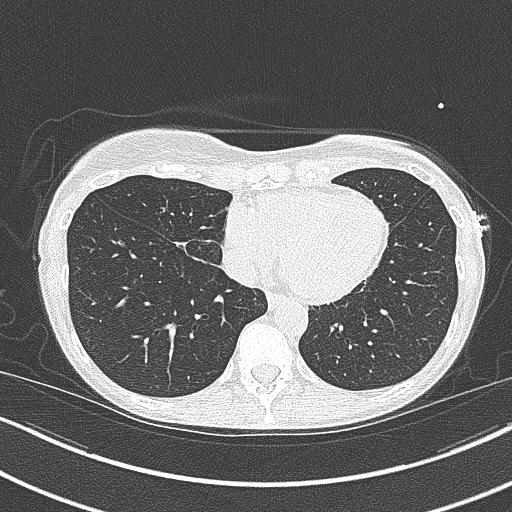
[im 43/86  lung]
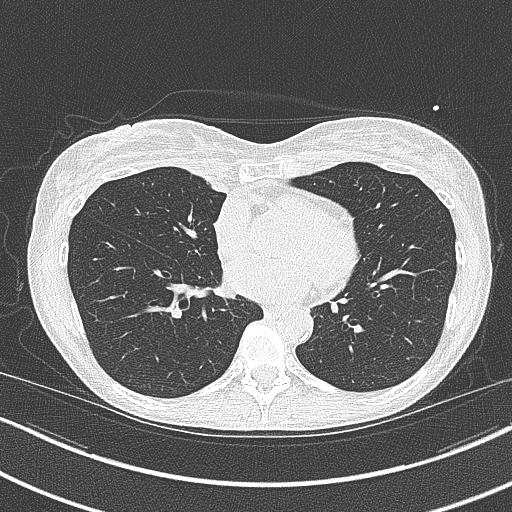
[im 57/86  lung]
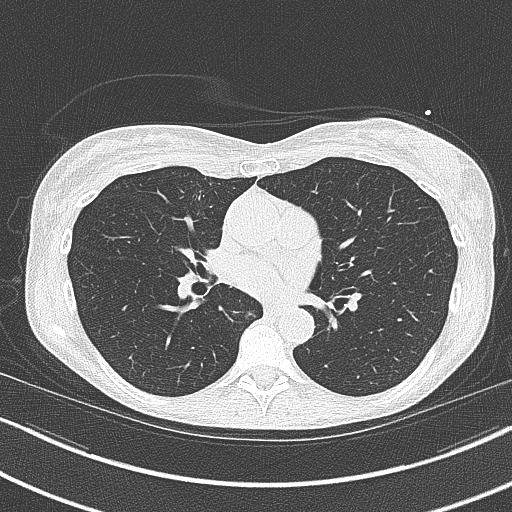
[im 71/86  lung]
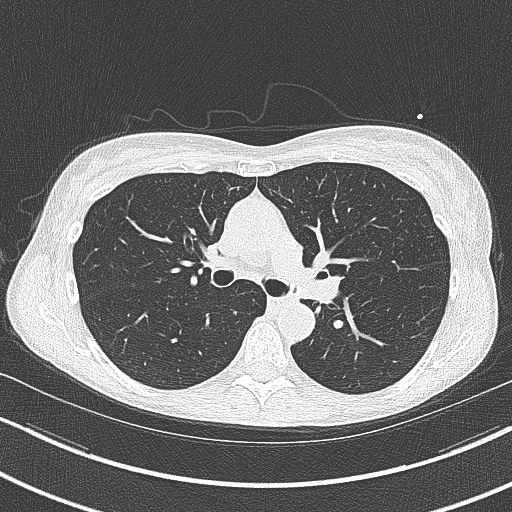

[Series 3: cascseq 3.0 sa36 70% (id) · axial · 0.34mm/px · z∈[+1025,+1067]mm · 2 of 56 slices shown]
[im 14/56  vessel]
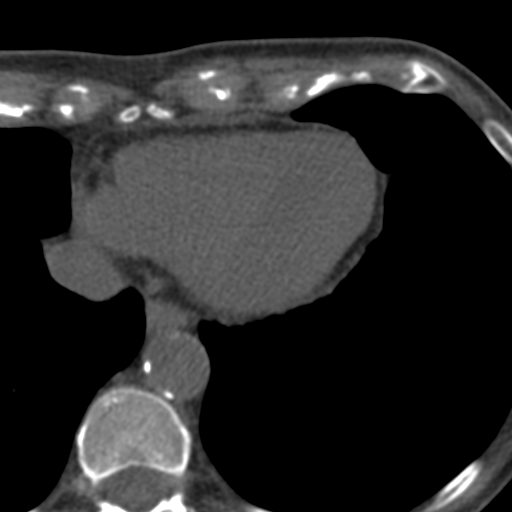
[im 28/56  vessel]
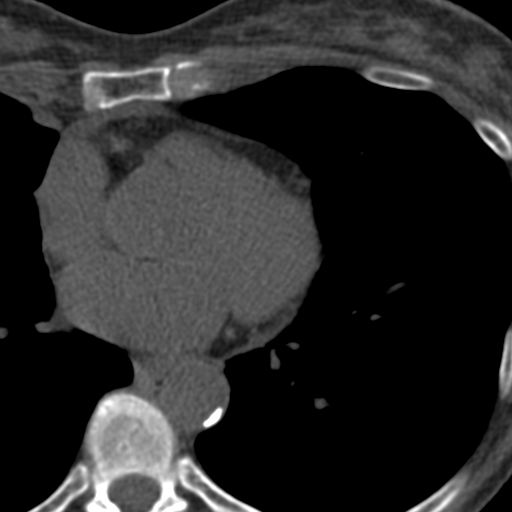

[Series 4: ax st · axial · 0.68mm/px · z∈[+1008,+1128]mm · 6 of 86 slices shown, 8 images]
[im 13/86  vessel]
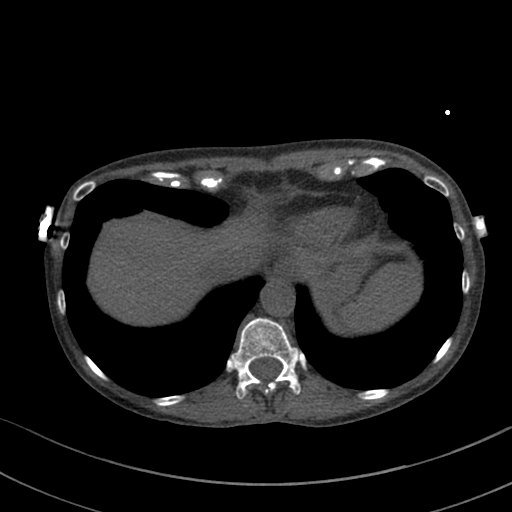
[im 13/86  lung]
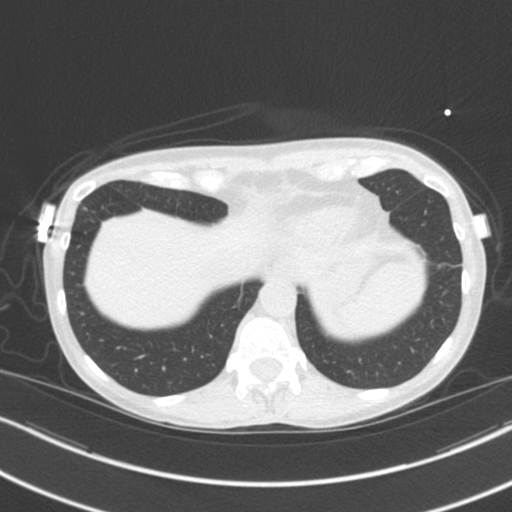
[im 25/86  vessel]
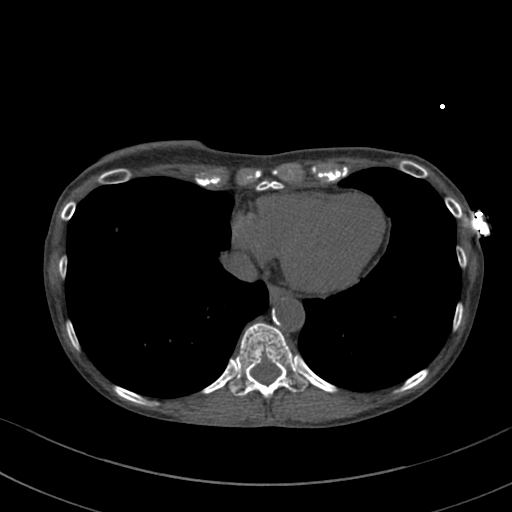
[im 37/86  vessel]
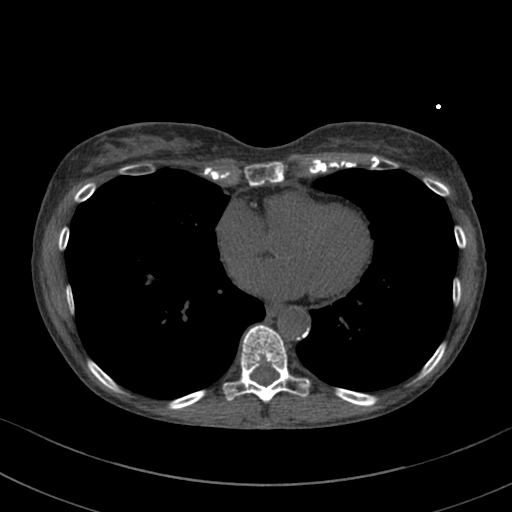
[im 49/86  vessel]
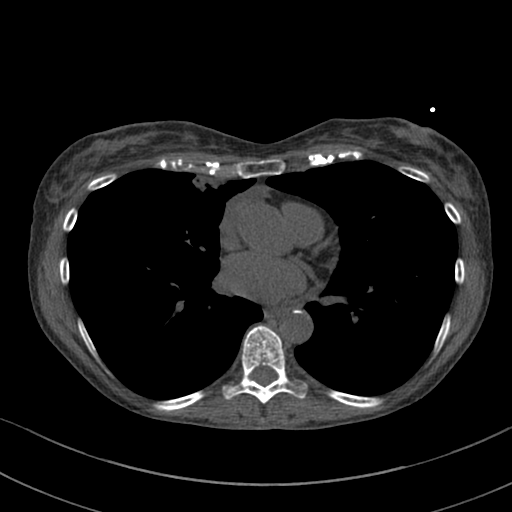
[im 61/86  vessel]
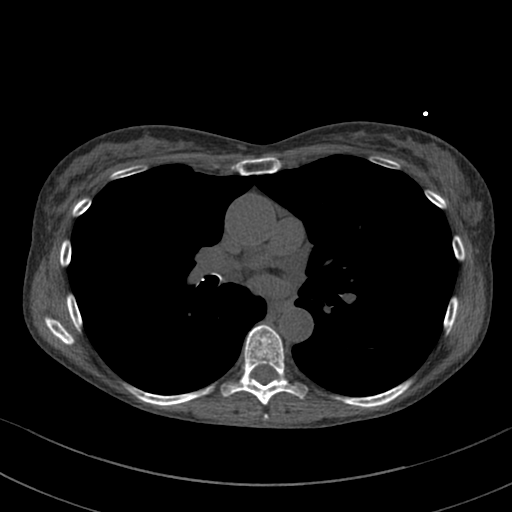
[im 61/86  lung]
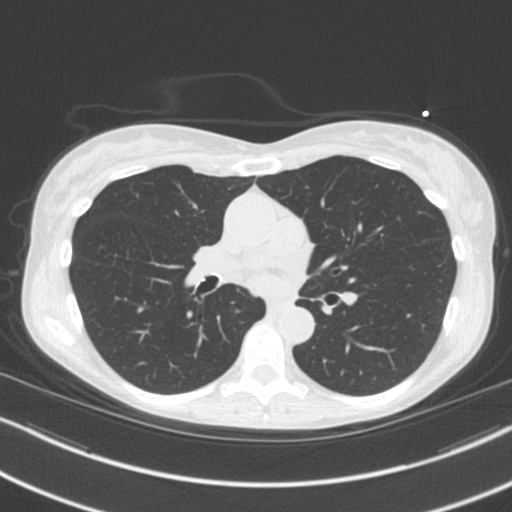
[im 73/86  vessel]
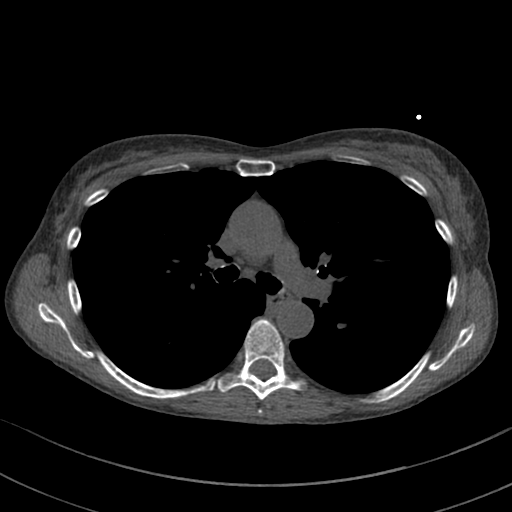

[13 of 20 positions shown; findings below may reference images not displayed]

FINDINGS: Coronary arteries: Normal origins.

Coronary Calcium Score:

Left main: 0

Left anterior descending artery:

Left circumflex artery: 0

Right coronary artery: 0

Total:

Percentile: 33

Pericardium: Normal.

Ascending Aorta: Normal caliber.

Non-cardiac: See separate report from [REDACTED].
IMPRESSION: Coronary calcium score of 1.83. This was 33 percentile for age-,
race-, and sex-matched controls.



If CAC=0, it is reasonable to withhold statin therapy and reassess
in 5 to 10 years, as long as higher risk conditions are absent
(diabetes mellitus, family history of premature CHD in first degree
relatives (males <55 years; females <65 years), cigarette smoking,
or LDL >=190 mg/dL).

If CAC is 1 to 99, it is reasonable to initiate statin therapy for
patients >=55 years of age.

If CAC is >=100 or >=75th percentile, it is reasonable to initiate
statin therapy at any age.

Cardiology referral should be considered for patients with CAC
scores >=400 or >=75th percentile.

*6196 AHA/ACC/AACVPR/AAPA/ABC/TAKIA/MEDHAT/JOSHJAX/Catalan/ANELLI/KLPIGBB/NENELELE
Guideline on the Management of Blood Cholesterol: A Report of the
American College of Cardiology/American Heart Association Task Force
on Clinical Practice Guidelines. J Am Coll Cardiol.
0978;73(24):8775-8102.

Jeypa Stiale Mojarrango, DO

The noncardiac portion of this study will be interpreted in separate
report by the radiologist.

EXAM:
OVER-READ INTERPRETATION  CT CHEST

The following report is an over-read performed by radiologist Dr.
does not include interpretation of cardiac or coronary anatomy or
pathology. The coronary calcium score interpretation by the
cardiologist is attached.
FINDINGS: Cardiovascular: Normal heart size. No significant pericardial
effusion/thickening. Atherosclerotic nonaneurysmal thoracic aorta.
Normal caliber pulmonary arteries.

Mediastinum/Nodes: Unremarkable esophagus. No pathologically
enlarged mediastinal or hilar lymph nodes, noting limited
sensitivity for the detection of hilar adenopathy on this
noncontrast study.

Lungs/Pleura: No pneumothorax. No pleural effusion. Focal bandlike
consolidation with associated mild volume loss and distortion in the
anteromedial basilar right upper lobe, compatible with
postinfectious/postinflammatory scarring. A few indistinct nodular
foci of consolidation scattered in the right lung, largest 1.1 cm in
the anterior right lower lobe (series 2/image 67).

Upper abdomen: No acute abnormality.

Musculoskeletal:  No aggressive appearing focal osseous lesions.
IMPRESSION: 1. A few indistinct nodular foci of consolidation scattered in the
right lung, largest 1.1 cm. Recommend follow-up noncontrast chest CT
in 3 months.
2. Focal bandlike postinfectious/postinflammatory scarring in the
basilar right upper lobe.
3. Aortic Atherosclerosis ([id number]).

These results will be called to the ordering clinician or
representative by the Radiologist Assistant, and communication
documented in the PACS or [REDACTED].

*** End of Addendum ***
FINDINGS: Coronary arteries: Normal origins.

Coronary Calcium Score:

Left main: 0

Left anterior descending artery:

Left circumflex artery: 0

Right coronary artery: 0

Total:

Percentile: 33

Pericardium: Normal.

Ascending Aorta: Normal caliber.

Non-cardiac: See separate report from [REDACTED].
IMPRESSION: Coronary calcium score of 1.83. This was 33 percentile for age-,
race-, and sex-matched controls.



If CAC=0, it is reasonable to withhold statin therapy and reassess
in 5 to 10 years, as long as higher risk conditions are absent
(diabetes mellitus, family history of premature CHD in first degree
relatives (males <55 years; females <65 years), cigarette smoking,
or LDL >=190 mg/dL).

If CAC is 1 to 99, it is reasonable to initiate statin therapy for
patients >=55 years of age.

If CAC is >=100 or >=75th percentile, it is reasonable to initiate
statin therapy at any age.

Cardiology referral should be considered for patients with CAC
scores >=400 or >=75th percentile.

*6196 AHA/ACC/AACVPR/AAPA/ABC/TAKIA/MEDHAT/JOSHJAX/Catalan/ANELLI/KLPIGBB/NENELELE
Guideline on the Management of Blood Cholesterol: A Report of the
American College of Cardiology/American Heart Association Task Force
on Clinical Practice Guidelines. J Am Coll Cardiol.
0978;73(24):8775-8102.

Jeypa Stiale Mojarrango, DO

The noncardiac portion of this study will be interpreted in separate
report by the radiologist.

## 2023-03-10 DIAGNOSIS — G8929 Other chronic pain: Secondary | ICD-10-CM | POA: Diagnosis not present

## 2023-03-10 DIAGNOSIS — I10 Essential (primary) hypertension: Secondary | ICD-10-CM | POA: Diagnosis not present

## 2023-08-07 DIAGNOSIS — M25512 Pain in left shoulder: Secondary | ICD-10-CM | POA: Diagnosis not present

## 2023-08-07 DIAGNOSIS — M25511 Pain in right shoulder: Secondary | ICD-10-CM | POA: Diagnosis not present

## 2023-08-10 DIAGNOSIS — H40013 Open angle with borderline findings, low risk, bilateral: Secondary | ICD-10-CM | POA: Diagnosis not present

## 2023-08-10 DIAGNOSIS — H43813 Vitreous degeneration, bilateral: Secondary | ICD-10-CM | POA: Diagnosis not present

## 2023-09-14 DIAGNOSIS — M25511 Pain in right shoulder: Secondary | ICD-10-CM | POA: Diagnosis not present

## 2023-09-14 DIAGNOSIS — M25512 Pain in left shoulder: Secondary | ICD-10-CM | POA: Diagnosis not present

## 2023-09-30 DIAGNOSIS — I10 Essential (primary) hypertension: Secondary | ICD-10-CM | POA: Diagnosis not present

## 2023-09-30 DIAGNOSIS — M799 Soft tissue disorder, unspecified: Secondary | ICD-10-CM | POA: Diagnosis not present

## 2023-09-30 DIAGNOSIS — M6281 Muscle weakness (generalized): Secondary | ICD-10-CM | POA: Diagnosis not present

## 2023-09-30 DIAGNOSIS — M25511 Pain in right shoulder: Secondary | ICD-10-CM | POA: Diagnosis not present

## 2023-10-05 DIAGNOSIS — M6281 Muscle weakness (generalized): Secondary | ICD-10-CM | POA: Diagnosis not present

## 2023-10-05 DIAGNOSIS — M25511 Pain in right shoulder: Secondary | ICD-10-CM | POA: Diagnosis not present

## 2023-10-05 DIAGNOSIS — I10 Essential (primary) hypertension: Secondary | ICD-10-CM | POA: Diagnosis not present

## 2023-10-05 DIAGNOSIS — M799 Soft tissue disorder, unspecified: Secondary | ICD-10-CM | POA: Diagnosis not present

## 2023-10-09 DIAGNOSIS — M6281 Muscle weakness (generalized): Secondary | ICD-10-CM | POA: Diagnosis not present

## 2023-10-09 DIAGNOSIS — M799 Soft tissue disorder, unspecified: Secondary | ICD-10-CM | POA: Diagnosis not present

## 2023-10-09 DIAGNOSIS — M25511 Pain in right shoulder: Secondary | ICD-10-CM | POA: Diagnosis not present

## 2023-10-09 DIAGNOSIS — I10 Essential (primary) hypertension: Secondary | ICD-10-CM | POA: Diagnosis not present

## 2023-10-12 DIAGNOSIS — M6281 Muscle weakness (generalized): Secondary | ICD-10-CM | POA: Diagnosis not present

## 2023-10-12 DIAGNOSIS — M799 Soft tissue disorder, unspecified: Secondary | ICD-10-CM | POA: Diagnosis not present

## 2023-10-12 DIAGNOSIS — M25511 Pain in right shoulder: Secondary | ICD-10-CM | POA: Diagnosis not present

## 2023-10-12 DIAGNOSIS — I10 Essential (primary) hypertension: Secondary | ICD-10-CM | POA: Diagnosis not present

## 2023-10-14 DIAGNOSIS — M799 Soft tissue disorder, unspecified: Secondary | ICD-10-CM | POA: Diagnosis not present

## 2023-10-14 DIAGNOSIS — I10 Essential (primary) hypertension: Secondary | ICD-10-CM | POA: Diagnosis not present

## 2023-10-14 DIAGNOSIS — M25511 Pain in right shoulder: Secondary | ICD-10-CM | POA: Diagnosis not present

## 2023-10-14 DIAGNOSIS — M6281 Muscle weakness (generalized): Secondary | ICD-10-CM | POA: Diagnosis not present

## 2023-10-20 DIAGNOSIS — M25511 Pain in right shoulder: Secondary | ICD-10-CM | POA: Diagnosis not present

## 2023-10-20 DIAGNOSIS — M799 Soft tissue disorder, unspecified: Secondary | ICD-10-CM | POA: Diagnosis not present

## 2023-10-20 DIAGNOSIS — I10 Essential (primary) hypertension: Secondary | ICD-10-CM | POA: Diagnosis not present

## 2023-10-20 DIAGNOSIS — M6281 Muscle weakness (generalized): Secondary | ICD-10-CM | POA: Diagnosis not present

## 2023-10-22 DIAGNOSIS — M799 Soft tissue disorder, unspecified: Secondary | ICD-10-CM | POA: Diagnosis not present

## 2023-10-22 DIAGNOSIS — M6281 Muscle weakness (generalized): Secondary | ICD-10-CM | POA: Diagnosis not present

## 2023-10-22 DIAGNOSIS — I10 Essential (primary) hypertension: Secondary | ICD-10-CM | POA: Diagnosis not present

## 2023-10-22 DIAGNOSIS — M25511 Pain in right shoulder: Secondary | ICD-10-CM | POA: Diagnosis not present

## 2023-10-26 DIAGNOSIS — G8929 Other chronic pain: Secondary | ICD-10-CM | POA: Diagnosis not present

## 2023-10-26 DIAGNOSIS — M25511 Pain in right shoulder: Secondary | ICD-10-CM | POA: Diagnosis not present

## 2023-10-26 DIAGNOSIS — M25512 Pain in left shoulder: Secondary | ICD-10-CM | POA: Diagnosis not present

## 2023-11-19 DIAGNOSIS — H40013 Open angle with borderline findings, low risk, bilateral: Secondary | ICD-10-CM | POA: Diagnosis not present

## 2023-12-07 ENCOUNTER — Other Ambulatory Visit: Payer: Self-pay | Admitting: Obstetrics and Gynecology

## 2023-12-07 DIAGNOSIS — Z1231 Encounter for screening mammogram for malignant neoplasm of breast: Secondary | ICD-10-CM

## 2023-12-14 DIAGNOSIS — E559 Vitamin D deficiency, unspecified: Secondary | ICD-10-CM | POA: Diagnosis not present

## 2023-12-14 DIAGNOSIS — Z79899 Other long term (current) drug therapy: Secondary | ICD-10-CM | POA: Diagnosis not present

## 2023-12-14 DIAGNOSIS — E2839 Other primary ovarian failure: Secondary | ICD-10-CM | POA: Diagnosis not present

## 2023-12-14 DIAGNOSIS — E7800 Pure hypercholesterolemia, unspecified: Secondary | ICD-10-CM | POA: Diagnosis not present

## 2023-12-14 DIAGNOSIS — Z0000 Encounter for general adult medical examination without abnormal findings: Secondary | ICD-10-CM | POA: Diagnosis not present

## 2023-12-14 DIAGNOSIS — G479 Sleep disorder, unspecified: Secondary | ICD-10-CM | POA: Diagnosis not present

## 2023-12-14 DIAGNOSIS — R42 Dizziness and giddiness: Secondary | ICD-10-CM | POA: Diagnosis not present

## 2023-12-14 DIAGNOSIS — E785 Hyperlipidemia, unspecified: Secondary | ICD-10-CM | POA: Diagnosis not present

## 2023-12-14 DIAGNOSIS — G8929 Other chronic pain: Secondary | ICD-10-CM | POA: Diagnosis not present

## 2023-12-30 DIAGNOSIS — H401221 Low-tension glaucoma, left eye, mild stage: Secondary | ICD-10-CM | POA: Diagnosis not present

## 2023-12-30 DIAGNOSIS — H40013 Open angle with borderline findings, low risk, bilateral: Secondary | ICD-10-CM | POA: Diagnosis not present

## 2024-01-07 ENCOUNTER — Ambulatory Visit
Admission: RE | Admit: 2024-01-07 | Discharge: 2024-01-07 | Disposition: A | Discharge status: Home / Self Care | Source: Ambulatory Visit | Attending: Obstetrics and Gynecology | Admitting: Obstetrics and Gynecology

## 2024-01-07 DIAGNOSIS — Z1231 Encounter for screening mammogram for malignant neoplasm of breast: Secondary | ICD-10-CM

## 2024-01-26 ENCOUNTER — Other Ambulatory Visit (HOSPITAL_BASED_OUTPATIENT_CLINIC_OR_DEPARTMENT_OTHER): Payer: Self-pay | Admitting: Family Medicine

## 2024-01-26 DIAGNOSIS — M8589 Other specified disorders of bone density and structure, multiple sites: Secondary | ICD-10-CM

## 2024-01-27 DIAGNOSIS — L821 Other seborrheic keratosis: Secondary | ICD-10-CM | POA: Diagnosis not present

## 2024-01-27 DIAGNOSIS — L723 Sebaceous cyst: Secondary | ICD-10-CM | POA: Diagnosis not present

## 2024-01-27 DIAGNOSIS — L853 Xerosis cutis: Secondary | ICD-10-CM | POA: Diagnosis not present

## 2024-01-27 DIAGNOSIS — D2271 Melanocytic nevi of right lower limb, including hip: Secondary | ICD-10-CM | POA: Diagnosis not present

## 2024-02-11 DIAGNOSIS — Z6821 Body mass index (BMI) 21.0-21.9, adult: Secondary | ICD-10-CM | POA: Diagnosis not present

## 2024-02-11 DIAGNOSIS — Z01419 Encounter for gynecological examination (general) (routine) without abnormal findings: Secondary | ICD-10-CM | POA: Diagnosis not present

## 2024-04-27 DIAGNOSIS — H35372 Puckering of macula, left eye: Secondary | ICD-10-CM | POA: Diagnosis not present

## 2024-04-27 DIAGNOSIS — H40011 Open angle with borderline findings, low risk, right eye: Secondary | ICD-10-CM | POA: Diagnosis not present

## 2024-04-27 DIAGNOSIS — H524 Presbyopia: Secondary | ICD-10-CM | POA: Diagnosis not present

## 2024-04-27 DIAGNOSIS — H401221 Low-tension glaucoma, left eye, mild stage: Secondary | ICD-10-CM | POA: Diagnosis not present

## 2024-04-27 DIAGNOSIS — H35363 Drusen (degenerative) of macula, bilateral: Secondary | ICD-10-CM | POA: Diagnosis not present

## 2024-06-10 DIAGNOSIS — M8580 Other specified disorders of bone density and structure, unspecified site: Secondary | ICD-10-CM | POA: Diagnosis not present

## 2024-06-10 DIAGNOSIS — E785 Hyperlipidemia, unspecified: Secondary | ICD-10-CM | POA: Diagnosis not present

## 2024-06-10 DIAGNOSIS — F411 Generalized anxiety disorder: Secondary | ICD-10-CM | POA: Diagnosis not present

## 2024-06-10 DIAGNOSIS — Z809 Family history of malignant neoplasm, unspecified: Secondary | ICD-10-CM | POA: Diagnosis not present

## 2024-06-10 DIAGNOSIS — G629 Polyneuropathy, unspecified: Secondary | ICD-10-CM | POA: Diagnosis not present

## 2024-06-10 DIAGNOSIS — I10 Essential (primary) hypertension: Secondary | ICD-10-CM | POA: Diagnosis not present

## 2024-06-10 DIAGNOSIS — Z818 Family history of other mental and behavioral disorders: Secondary | ICD-10-CM | POA: Diagnosis not present

## 2024-06-10 DIAGNOSIS — H409 Unspecified glaucoma: Secondary | ICD-10-CM | POA: Diagnosis not present

## 2024-06-10 DIAGNOSIS — G4700 Insomnia, unspecified: Secondary | ICD-10-CM | POA: Diagnosis not present

## 2024-06-15 DIAGNOSIS — M5414 Radiculopathy, thoracic region: Secondary | ICD-10-CM | POA: Diagnosis not present

## 2024-06-15 DIAGNOSIS — G8929 Other chronic pain: Secondary | ICD-10-CM | POA: Diagnosis not present

## 2024-06-15 DIAGNOSIS — G4700 Insomnia, unspecified: Secondary | ICD-10-CM | POA: Diagnosis not present

## 2024-06-15 DIAGNOSIS — E785 Hyperlipidemia, unspecified: Secondary | ICD-10-CM | POA: Diagnosis not present

## 2024-06-15 DIAGNOSIS — I10 Essential (primary) hypertension: Secondary | ICD-10-CM | POA: Diagnosis not present
